# Patient Record
Sex: Female | Born: 1945 | Race: White | Hispanic: No | Marital: Married | Smoking: Never smoker
Health system: Southern US, Community
[De-identification: ages and names within clinical notes are randomized; demographics above are authoritative.]

## PROBLEM LIST (undated history)

## (undated) HISTORY — PX: OTHER SURGICAL HISTORY: SHX169

## (undated) HISTORY — PX: PARTIAL HYSTERECTOMY: SHX80

## (undated) HISTORY — PX: APPENDECTOMY: SHX54

---

## 1999-06-22 ENCOUNTER — Other Ambulatory Visit: Admission: RE | Admit: 1999-06-22 | Discharge: 1999-06-22 | Payer: Self-pay | Admitting: Family Medicine

## 2000-08-29 ENCOUNTER — Other Ambulatory Visit: Admission: RE | Admit: 2000-08-29 | Discharge: 2000-08-29 | Payer: Self-pay | Admitting: Family Medicine

## 2001-04-04 ENCOUNTER — Ambulatory Visit: Admission: RE | Admit: 2001-04-04 | Discharge: 2001-04-04 | Payer: Self-pay | Admitting: Gynecology

## 2001-05-10 ENCOUNTER — Encounter: Payer: Self-pay | Admitting: Gynecology

## 2001-05-15 ENCOUNTER — Inpatient Hospital Stay (HOSPITAL_COMMUNITY): Admission: RE | Admit: 2001-05-15 | Discharge: 2001-05-18 | Payer: Self-pay | Admitting: Gynecology

## 2001-05-22 ENCOUNTER — Inpatient Hospital Stay (HOSPITAL_COMMUNITY): Admission: AD | Admit: 2001-05-22 | Discharge: 2001-05-22 | Payer: Self-pay | Admitting: *Deleted

## 2001-05-25 ENCOUNTER — Encounter: Payer: Self-pay | Admitting: Anesthesiology

## 2001-05-25 ENCOUNTER — Inpatient Hospital Stay (HOSPITAL_COMMUNITY): Admission: AD | Admit: 2001-05-25 | Discharge: 2001-06-06 | Payer: Self-pay | Admitting: Obstetrics & Gynecology

## 2001-05-27 ENCOUNTER — Encounter: Payer: Self-pay | Admitting: Obstetrics & Gynecology

## 2001-05-28 ENCOUNTER — Encounter (HOSPITAL_BASED_OUTPATIENT_CLINIC_OR_DEPARTMENT_OTHER): Payer: Self-pay | Admitting: General Surgery

## 2001-05-29 ENCOUNTER — Encounter: Payer: Self-pay | Admitting: Obstetrics & Gynecology

## 2001-06-01 ENCOUNTER — Encounter: Payer: Self-pay | Admitting: Obstetrics & Gynecology

## 2001-07-11 ENCOUNTER — Ambulatory Visit: Admission: RE | Admit: 2001-07-11 | Discharge: 2001-07-11 | Payer: Self-pay | Admitting: Gynecology

## 2004-09-08 ENCOUNTER — Ambulatory Visit: Payer: Self-pay | Admitting: Family Medicine

## 2005-04-08 ENCOUNTER — Ambulatory Visit: Payer: Self-pay | Admitting: Family Medicine

## 2005-05-04 ENCOUNTER — Ambulatory Visit: Payer: Self-pay | Admitting: Family Medicine

## 2005-10-25 ENCOUNTER — Ambulatory Visit: Payer: Self-pay | Admitting: Family Medicine

## 2005-12-06 ENCOUNTER — Ambulatory Visit: Payer: Self-pay | Admitting: Family Medicine

## 2005-12-15 ENCOUNTER — Ambulatory Visit: Payer: Self-pay | Admitting: Family Medicine

## 2005-12-21 ENCOUNTER — Ambulatory Visit: Payer: Self-pay | Admitting: Family Medicine

## 2012-04-18 DIAGNOSIS — R32 Unspecified urinary incontinence: Secondary | ICD-10-CM

## 2012-04-18 DIAGNOSIS — M1990 Unspecified osteoarthritis, unspecified site: Secondary | ICD-10-CM

## 2012-04-18 DIAGNOSIS — R0602 Shortness of breath: Secondary | ICD-10-CM | POA: Insufficient documentation

## 2012-04-18 DIAGNOSIS — M549 Dorsalgia, unspecified: Secondary | ICD-10-CM

## 2012-04-18 DIAGNOSIS — I10 Essential (primary) hypertension: Secondary | ICD-10-CM

## 2012-04-18 DIAGNOSIS — G4733 Obstructive sleep apnea (adult) (pediatric): Secondary | ICD-10-CM

## 2012-04-18 DIAGNOSIS — E785 Hyperlipidemia, unspecified: Secondary | ICD-10-CM

## 2012-04-18 HISTORY — DX: Unspecified osteoarthritis, unspecified site: M19.90

## 2012-04-18 HISTORY — DX: Unspecified urinary incontinence: R32

## 2012-04-18 HISTORY — DX: Hyperlipidemia, unspecified: E78.5

## 2012-04-18 HISTORY — DX: Shortness of breath: R06.02

## 2012-04-18 HISTORY — DX: Obstructive sleep apnea (adult) (pediatric): G47.33

## 2012-04-18 HISTORY — DX: Dorsalgia, unspecified: M54.9

## 2012-04-18 HISTORY — DX: Essential (primary) hypertension: I10

## 2015-12-01 DIAGNOSIS — Z79899 Other long term (current) drug therapy: Secondary | ICD-10-CM | POA: Insufficient documentation

## 2015-12-01 DIAGNOSIS — G5712 Meralgia paresthetica, left lower limb: Secondary | ICD-10-CM | POA: Insufficient documentation

## 2015-12-01 DIAGNOSIS — G5600 Carpal tunnel syndrome, unspecified upper limb: Secondary | ICD-10-CM | POA: Insufficient documentation

## 2015-12-01 DIAGNOSIS — M51369 Other intervertebral disc degeneration, lumbar region without mention of lumbar back pain or lower extremity pain: Secondary | ICD-10-CM

## 2015-12-01 DIAGNOSIS — K439 Ventral hernia without obstruction or gangrene: Secondary | ICD-10-CM

## 2015-12-01 DIAGNOSIS — M5136 Other intervertebral disc degeneration, lumbar region: Secondary | ICD-10-CM

## 2015-12-01 DIAGNOSIS — M1710 Unilateral primary osteoarthritis, unspecified knee: Secondary | ICD-10-CM

## 2015-12-01 DIAGNOSIS — L304 Erythema intertrigo: Secondary | ICD-10-CM

## 2015-12-01 HISTORY — DX: Meralgia paresthetica, left lower limb: G57.12

## 2015-12-01 HISTORY — DX: Erythema intertrigo: L30.4

## 2015-12-01 HISTORY — DX: Ventral hernia without obstruction or gangrene: K43.9

## 2015-12-01 HISTORY — DX: Other long term (current) drug therapy: Z79.899

## 2015-12-01 HISTORY — DX: Other intervertebral disc degeneration, lumbar region without mention of lumbar back pain or lower extremity pain: M51.369

## 2015-12-01 HISTORY — DX: Unilateral primary osteoarthritis, unspecified knee: M17.10

## 2015-12-01 HISTORY — DX: Other intervertebral disc degeneration, lumbar region: M51.36

## 2015-12-08 DIAGNOSIS — R1314 Dysphagia, pharyngoesophageal phase: Secondary | ICD-10-CM | POA: Insufficient documentation

## 2015-12-08 DIAGNOSIS — K219 Gastro-esophageal reflux disease without esophagitis: Secondary | ICD-10-CM

## 2015-12-08 HISTORY — DX: Gastro-esophageal reflux disease without esophagitis: K21.9

## 2015-12-08 HISTORY — DX: Dysphagia, pharyngoesophageal phase: R13.14

## 2015-12-17 DIAGNOSIS — Z6841 Body Mass Index (BMI) 40.0 and over, adult: Secondary | ICD-10-CM

## 2015-12-17 DIAGNOSIS — R319 Hematuria, unspecified: Secondary | ICD-10-CM

## 2015-12-17 DIAGNOSIS — K5909 Other constipation: Secondary | ICD-10-CM

## 2015-12-17 DIAGNOSIS — I872 Venous insufficiency (chronic) (peripheral): Secondary | ICD-10-CM | POA: Insufficient documentation

## 2015-12-17 DIAGNOSIS — R609 Edema, unspecified: Secondary | ICD-10-CM

## 2015-12-17 HISTORY — DX: Other constipation: K59.09

## 2015-12-17 HISTORY — DX: Body Mass Index (BMI) 40.0 and over, adult: Z684

## 2015-12-17 HISTORY — DX: Venous insufficiency (chronic) (peripheral): I87.2

## 2015-12-17 HISTORY — DX: Hematuria, unspecified: R31.9

## 2015-12-17 HISTORY — DX: Edema, unspecified: R60.9

## 2016-03-09 DIAGNOSIS — R5383 Other fatigue: Secondary | ICD-10-CM

## 2016-03-09 HISTORY — DX: Other fatigue: R53.83

## 2019-01-24 DIAGNOSIS — R269 Unspecified abnormalities of gait and mobility: Secondary | ICD-10-CM | POA: Insufficient documentation

## 2019-01-24 HISTORY — DX: Unspecified abnormalities of gait and mobility: R26.9

## 2019-01-25 DIAGNOSIS — E669 Obesity, unspecified: Secondary | ICD-10-CM

## 2019-01-25 DIAGNOSIS — E1169 Type 2 diabetes mellitus with other specified complication: Secondary | ICD-10-CM | POA: Insufficient documentation

## 2019-01-25 HISTORY — DX: Type 2 diabetes mellitus with other specified complication: E66.9

## 2019-01-31 DIAGNOSIS — R195 Other fecal abnormalities: Secondary | ICD-10-CM

## 2019-01-31 DIAGNOSIS — K921 Melena: Secondary | ICD-10-CM

## 2019-01-31 HISTORY — DX: Other fecal abnormalities: R19.5

## 2019-01-31 HISTORY — DX: Melena: K92.1

## 2019-03-15 ENCOUNTER — Other Ambulatory Visit: Payer: Self-pay | Admitting: Unknown Physician Specialty

## 2019-03-15 DIAGNOSIS — R195 Other fecal abnormalities: Secondary | ICD-10-CM

## 2019-04-09 ENCOUNTER — Ambulatory Visit
Admission: RE | Admit: 2019-04-09 | Discharge: 2019-04-09 | Disposition: A | Discharge status: Home / Self Care | Payer: Medicare Other | Source: Ambulatory Visit | Attending: Unknown Physician Specialty | Admitting: Unknown Physician Specialty

## 2019-04-09 DIAGNOSIS — R195 Other fecal abnormalities: Secondary | ICD-10-CM

## 2019-07-17 DIAGNOSIS — Z789 Other specified health status: Secondary | ICD-10-CM

## 2019-07-17 HISTORY — DX: Other specified health status: Z78.9

## 2019-10-29 DIAGNOSIS — L602 Onychogryphosis: Secondary | ICD-10-CM

## 2019-10-29 HISTORY — DX: Onychogryphosis: L60.2

## 2020-01-20 DIAGNOSIS — M79672 Pain in left foot: Secondary | ICD-10-CM | POA: Insufficient documentation

## 2020-01-20 HISTORY — DX: Pain in left foot: M79.672

## 2020-09-22 IMAGING — CT CT VIRTUAL COLONOSCOPY DIAGNOSTIC
2 of 5 series · 15 of 46 positions shown, 17 images · non-contrast
Comparison: [facility] CT abdomen/pelvis dated 01/14/2013

CLINICAL DATA: Occult GI bleeding

EXAM:
CT VIRTUAL COLONOSCOPY DIAGNOSTIC
TECHNIQUE: The patient was given a standard Mag citrate bowel preparation with
Gastrografin and barium for fluid and stool tagging respectively.
The quality of the bowel preparation is moderate. Automated CO2
insufflation of the colon was performed prior to image acquisition
and colonic distention is moderate. Image post processing was used
to generate a 3D endoluminal fly-through projection of the colon and
to electronically subtract stool/fluid as appropriate.

[Series 2: supine colon 1.50 br40 s3 supine thins · axial · 0.98mm/px · z∈[+1277,+1672]mm · 12 of 295 slices shown, 14 images]
[im 16/295  soft-tissue]
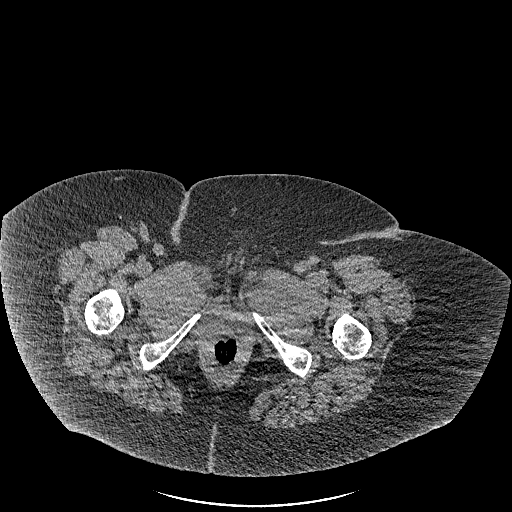
[im 16/295  bone]
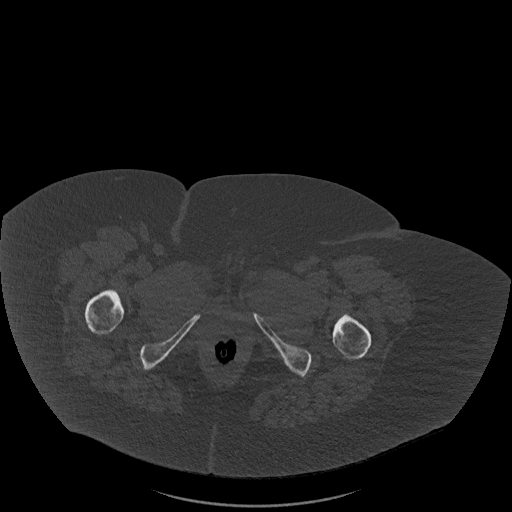
[im 47/295  soft-tissue]
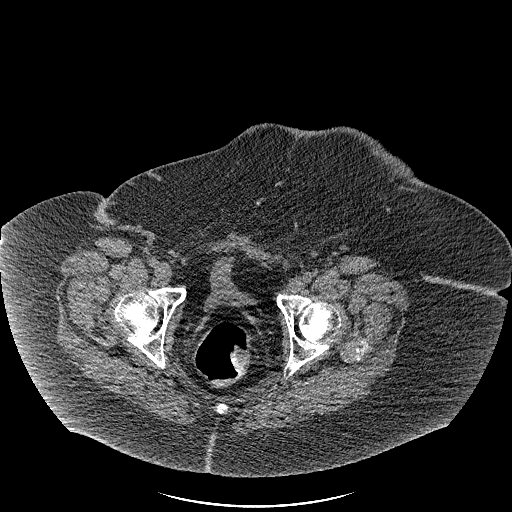
[im 62/295  soft-tissue]
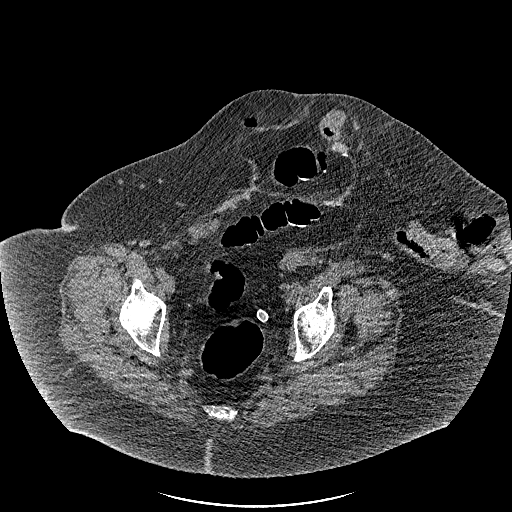
[im 93/295  soft-tissue]
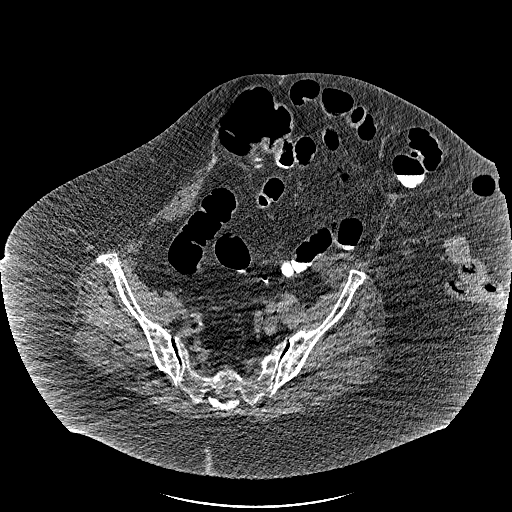
[im 109/295  soft-tissue]
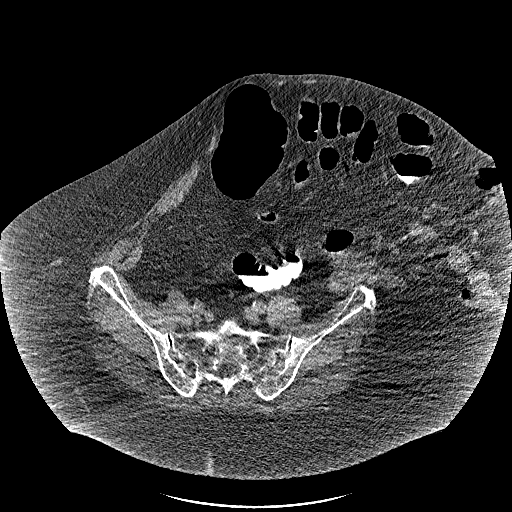
[im 140/295  soft-tissue]
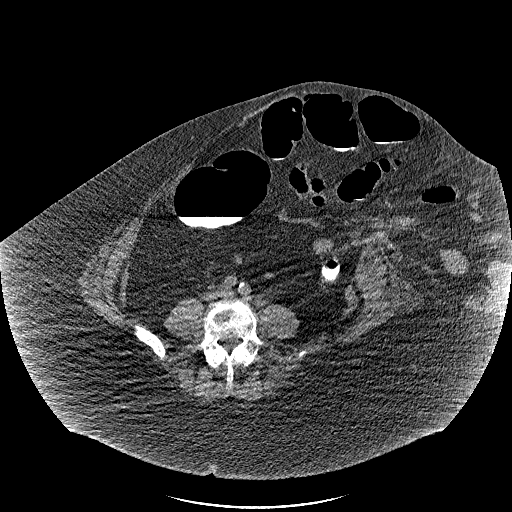
[im 155/295  soft-tissue]
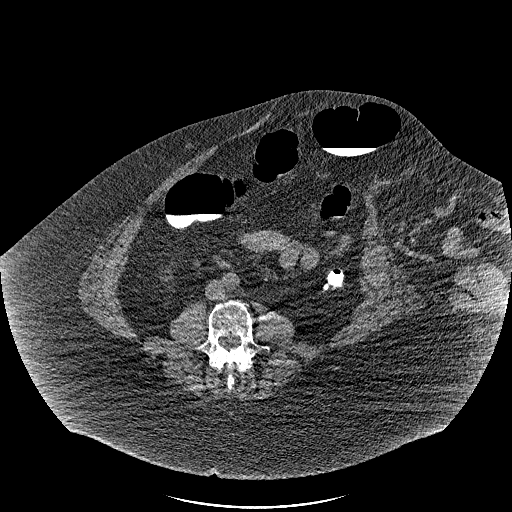
[im 186/295  soft-tissue]
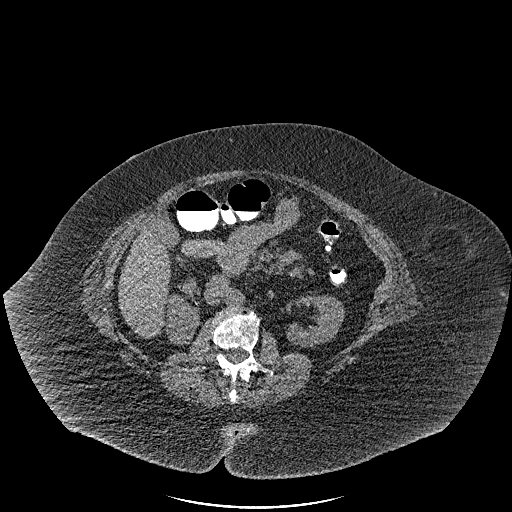
[im 202/295  soft-tissue]
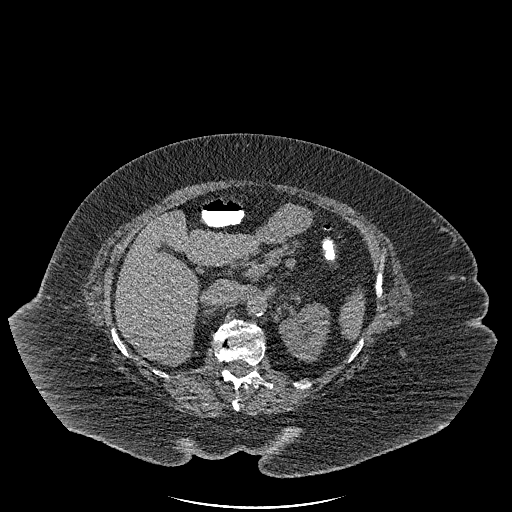
[im 202/295  bone]
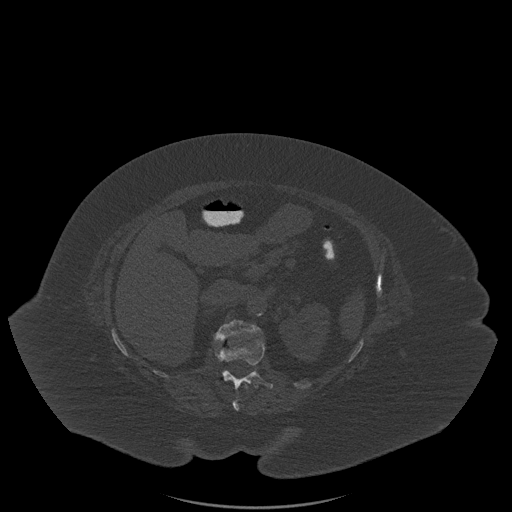
[im 233/295  soft-tissue]
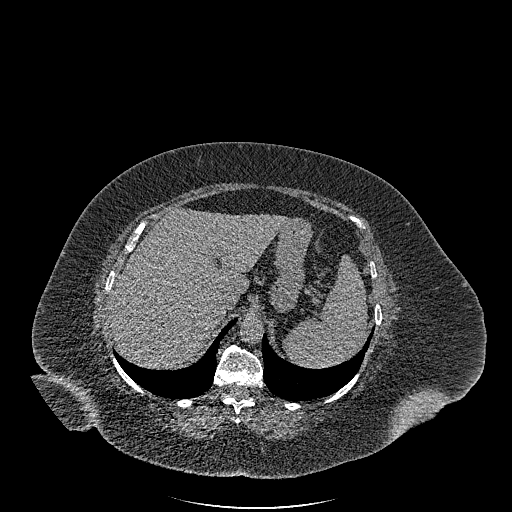
[im 248/295  soft-tissue]
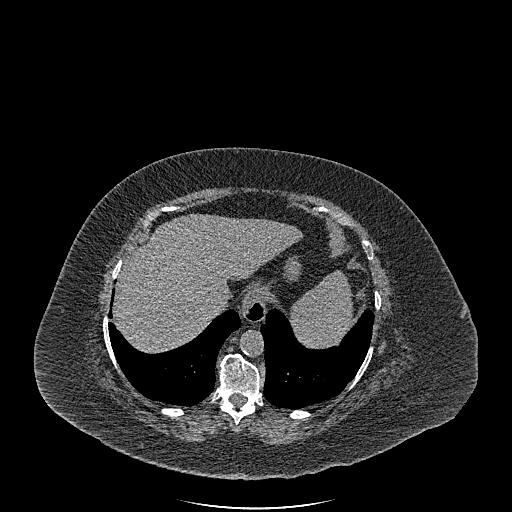
[im 279/295  soft-tissue]
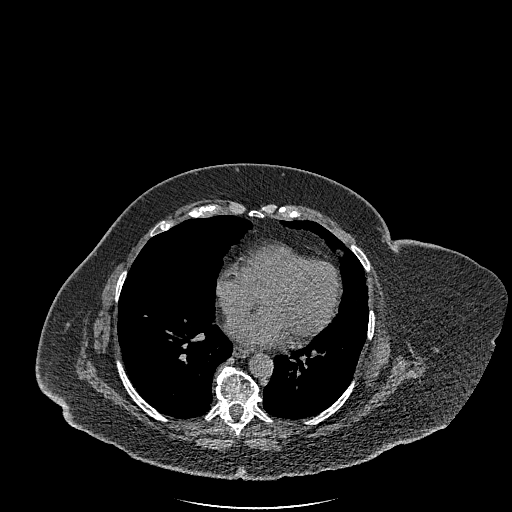

[Series 4: supine colon 3.00 br40 s3 cor supine · coronal · 0.88mm/px · 3 of 156 slices shown]
[im 52/156  soft-tissue]
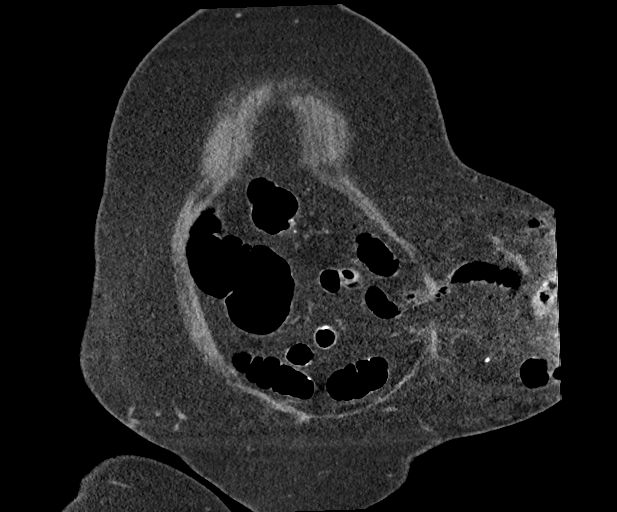
[im 69/156  soft-tissue]
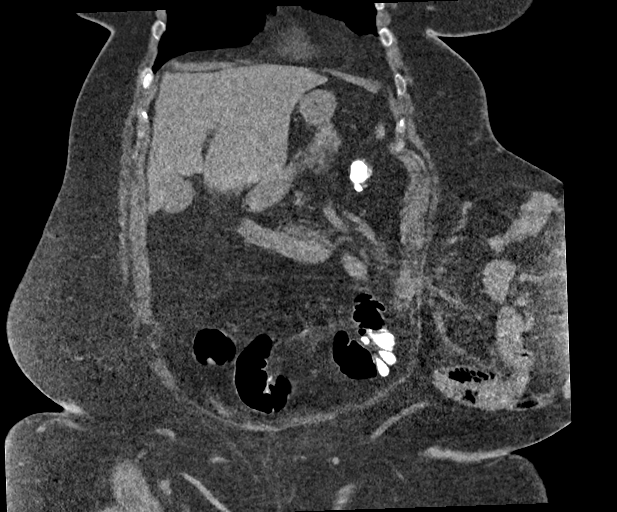
[im 87/156  soft-tissue]
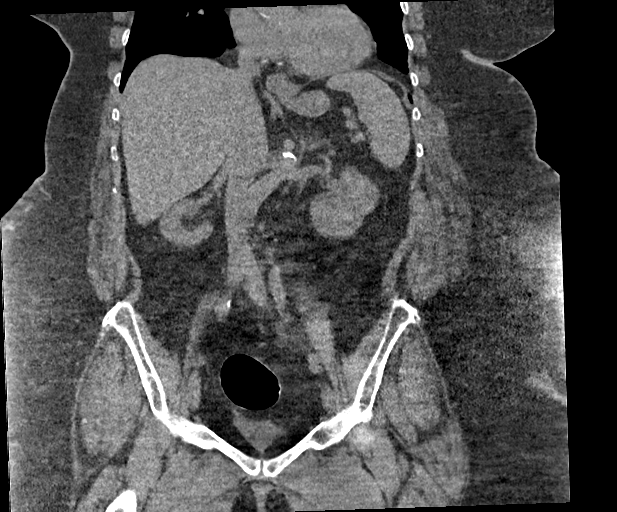

[15 of 46 positions shown; findings below may reference images not displayed]

FINDINGS: VIRTUAL COLONOSCOPY

Mild layering fluid/contrast in the colon. Patient was unable to be
repositioned for imaging in the prone or right lateral decubitus
position, and as such, the fluid/contrast mildly limits evaluation.

Large complex left lateral ventral hernia, mostly containing loops
of small bowel (incompletely imaged), and therefore not appreciably
limiting evaluation of the colon.

No significant colonic polyp, mass, apple core lesion, or stricture.

Redundant sigmoid colon. Left colonic diverticulosis, without
evidence of diverticulitis.

No evidence of bowel obstruction. Large, complex left ventral hernia
containing multiple loops of predominantly small bowel, incompletely
visualized.

Appendix is not discretely visualized.

Virtual colonoscopy is not designed to detect diminutive polyps
(i.e., less than or equal to 5 mm), the presence or absence of which
may not affect clinical management.

CT ABDOMEN AND PELVIS WITHOUT CONTRAST

Lower chest: Lung bases are clear.

Hepatobiliary: Unenhanced liver is unremarkable.

Gallbladder is unremarkable. No intrahepatic or extrahepatic ductal
dilatation.

Pancreas: Within normal limits.

Spleen: Within normal limits.

Adrenals/Urinary Tract: Adrenal glands are within normal limits.

Kidneys are within normal limits. No renal calculi hydronephrosis.

Bladder is underdistended but unremarkable.

Stomach/Bowel: Stomach is notable for a small hiatal hernia.

Visualized bowel is described above.

Vascular/Lymphatic: No evidence of abdominal aortic aneurysm.

Atherosclerotic calcifications of the abdominal aorta and branch
vessels.

No suspicious abdominopelvic lymphadenopathy.

Reproductive: Status post hysterectomy.  No adnexal masses.

Other: No abdominopelvic ascites.

Musculoskeletal: Degenerative changes of the visualized
thoracolumbar spine.
IMPRESSION: No significant colonic polyp, mass, apple core lesion, or stricture.

Left colonic diverticulosis, without evidence of diverticulitis.

Large, complex left ventral hernia containing multiple loops of
predominantly small bowel, incompletely imaged.

Additional ancillary findings as above.

## 2020-09-28 DIAGNOSIS — I350 Nonrheumatic aortic (valve) stenosis: Secondary | ICD-10-CM | POA: Diagnosis not present

## 2020-09-28 DIAGNOSIS — I340 Nonrheumatic mitral (valve) insufficiency: Secondary | ICD-10-CM | POA: Diagnosis not present

## 2020-09-28 DIAGNOSIS — I361 Nonrheumatic tricuspid (valve) insufficiency: Secondary | ICD-10-CM | POA: Diagnosis not present

## 2020-10-02 DIAGNOSIS — I480 Paroxysmal atrial fibrillation: Secondary | ICD-10-CM

## 2020-10-02 DIAGNOSIS — R55 Syncope and collapse: Secondary | ICD-10-CM

## 2020-10-02 DIAGNOSIS — R42 Dizziness and giddiness: Secondary | ICD-10-CM

## 2020-10-02 DIAGNOSIS — F418 Other specified anxiety disorders: Secondary | ICD-10-CM | POA: Insufficient documentation

## 2020-10-02 HISTORY — DX: Paroxysmal atrial fibrillation: I48.0

## 2020-10-02 HISTORY — DX: Syncope and collapse: R55

## 2020-10-02 HISTORY — DX: Syncope and collapse: R42

## 2020-10-02 HISTORY — DX: Other specified anxiety disorders: F41.8

## 2020-10-05 DIAGNOSIS — Z6841 Body Mass Index (BMI) 40.0 and over, adult: Secondary | ICD-10-CM

## 2020-10-05 DIAGNOSIS — Z7901 Long term (current) use of anticoagulants: Secondary | ICD-10-CM | POA: Insufficient documentation

## 2020-10-05 HISTORY — DX: Body Mass Index (BMI) 40.0 and over, adult: Z684

## 2020-10-05 HISTORY — DX: Morbid (severe) obesity due to excess calories: E66.01

## 2020-10-05 HISTORY — DX: Long term (current) use of anticoagulants: Z79.01

## 2020-11-24 DIAGNOSIS — Z7901 Long term (current) use of anticoagulants: Secondary | ICD-10-CM | POA: Insufficient documentation

## 2020-11-24 DIAGNOSIS — S32040A Wedge compression fracture of fourth lumbar vertebra, initial encounter for closed fracture: Secondary | ICD-10-CM

## 2020-11-24 HISTORY — DX: Long term (current) use of anticoagulants: Z79.01

## 2020-11-24 HISTORY — DX: Wedge compression fracture of fourth lumbar vertebra, initial encounter for closed fracture: S32.040A

## 2021-01-19 DIAGNOSIS — I10 Essential (primary) hypertension: Secondary | ICD-10-CM

## 2021-01-19 HISTORY — DX: Essential (primary) hypertension: I10

## 2021-07-19 DIAGNOSIS — Z636 Dependent relative needing care at home: Secondary | ICD-10-CM

## 2021-07-19 HISTORY — DX: Dependent relative needing care at home: Z63.6

## 2021-09-01 DIAGNOSIS — I951 Orthostatic hypotension: Secondary | ICD-10-CM | POA: Insufficient documentation

## 2021-09-01 HISTORY — DX: Orthostatic hypotension: I95.1

## 2021-09-02 ENCOUNTER — Ambulatory Visit: Payer: Medicare Other

## 2021-09-02 ENCOUNTER — Telehealth: Payer: Self-pay | Admitting: Cardiology

## 2021-09-02 DIAGNOSIS — I4891 Unspecified atrial fibrillation: Secondary | ICD-10-CM

## 2021-09-02 NOTE — Telephone Encounter (Signed)
Spoke to the patient just now and got her scheduled to come tomorrow at 10 am for this monitor to be placed.    Encouraged patient to call back with any questions or concerns.

## 2021-09-02 NOTE — Telephone Encounter (Signed)
I called patient to schedule a consult per her PCP. She stated her PCP is requesting a heart monitor. Could this be done before her appointment with Dr. Dulce Sellar on 10/22/21? If so, please order the monitor and let me know. I will call  the patient to schedule appt for heart monitor.  Thanks!

## 2021-09-03 ENCOUNTER — Ambulatory Visit: Payer: Medicare Other

## 2021-09-03 ENCOUNTER — Other Ambulatory Visit: Payer: Self-pay

## 2021-09-03 ENCOUNTER — Ambulatory Visit (INDEPENDENT_AMBULATORY_CARE_PROVIDER_SITE_OTHER): Payer: Medicare Other

## 2021-09-03 DIAGNOSIS — I4891 Unspecified atrial fibrillation: Secondary | ICD-10-CM

## 2021-09-17 ENCOUNTER — Telehealth: Payer: Self-pay

## 2021-09-17 NOTE — Telephone Encounter (Signed)
Spoke with patient regarding results and recommendation.  Patient verbalizes understanding and is agreeable to plan of care. Advised patient to call back with any issues or concerns.  

## 2021-09-17 NOTE — Telephone Encounter (Signed)
-----   Message from Baldo Daub, MD sent at 09/17/2021 11:33 AM EST ----- She is scheduled to see me at the end of January the monitor showed atrial fibrillation with a well-controlled rate please be sure a copy of this goes to her primary care physician they want the monitoring done before the office visit.  She is a new patient.

## 2021-10-19 ENCOUNTER — Other Ambulatory Visit: Payer: Self-pay

## 2021-10-19 DIAGNOSIS — K432 Incisional hernia without obstruction or gangrene: Secondary | ICD-10-CM

## 2021-10-19 HISTORY — DX: Incisional hernia without obstruction or gangrene: K43.2

## 2021-10-22 ENCOUNTER — Other Ambulatory Visit: Payer: Self-pay

## 2021-10-22 ENCOUNTER — Encounter: Payer: Self-pay | Admitting: Cardiology

## 2021-10-22 ENCOUNTER — Ambulatory Visit (INDEPENDENT_AMBULATORY_CARE_PROVIDER_SITE_OTHER): Payer: Medicare Other | Admitting: Cardiology

## 2021-10-22 VITALS — BP 138/76 | HR 106 | Ht 62.0 in | Wt 292.6 lb

## 2021-10-22 DIAGNOSIS — Q231 Congenital insufficiency of aortic valve: Secondary | ICD-10-CM

## 2021-10-22 DIAGNOSIS — I951 Orthostatic hypotension: Secondary | ICD-10-CM | POA: Diagnosis not present

## 2021-10-22 DIAGNOSIS — I4821 Permanent atrial fibrillation: Secondary | ICD-10-CM

## 2021-10-22 DIAGNOSIS — Z7901 Long term (current) use of anticoagulants: Secondary | ICD-10-CM | POA: Diagnosis not present

## 2021-10-22 DIAGNOSIS — I119 Hypertensive heart disease without heart failure: Secondary | ICD-10-CM | POA: Insufficient documentation

## 2021-10-22 DIAGNOSIS — E1169 Type 2 diabetes mellitus with other specified complication: Secondary | ICD-10-CM

## 2021-10-22 DIAGNOSIS — E669 Obesity, unspecified: Secondary | ICD-10-CM

## 2021-10-22 DIAGNOSIS — I350 Nonrheumatic aortic (valve) stenosis: Secondary | ICD-10-CM

## 2021-10-22 NOTE — Progress Notes (Signed)
Cardiology Office Note:    Date:  10/22/2021   ID:  Erika Yang, DOB 1946-02-24, MRN [id number]  PCP:  Maris Berger, MD  Cardiologist:  Shirlee More, MD   Referring MD: Nehemiah Settle, MD  ASSESSMENT:    1. Permanent atrial fibrillation ([place])   2. Chronic anticoagulation   3. Hypertensive heart disease without heart failure   4. Hypotension, postural   5. Diabetes mellitus type 2 in obese ([place])   6. Bicuspid aortic valve   7. Nonrheumatic aortic valve stenosis    PLAN:    In order of problems listed above:  She has had permanent or chronic atrial fibrillation in excess of 1 year the rate is well controlled on extended ambulatory monitor and she is anticoagulated.  Her episodes are unrelated to her cardiac rhythm I agree with her PCP that is orthostatic in nature she remains on her ARB continues to have episodes and from a safety perspective I told her to discontinue the drug.  She will start to trend her blood pressure problems sit standing.  She has an appointment see Dr. Dema Severin in the near future.  She will continue her rate limiting calcium channel blocker Continue anticoagulation moderate stroke risk with age hypertension and diabetes. See above probably discontinue her calcium channel blocker having symptomatic episodes of hypotension related to prolonged standing Stable diabetes currently on metformin She has mild aortic stenosis will need a repeat echocardiogram in about 1 year I will have her back in my office generally progression is very slow however can be more rapid in patients with bicuspid aortic valve. She has plans to see orthopedic surgery for ambulatory dysfunction and what sounds like locking of her knee usually related to very severe degenerative knee changes.  Next appointment 1 year   Medication Adjustments/Labs and Tests Ordered: Current medicines are reviewed at length with the patient today.  Concerns regarding medicines are outlined above.  Orders  Placed This Encounter  Procedures   EKG 12-Lead   No orders of the defined types were placed in this encounter.    Chief Complaint  Patient presents with   Loss of Consciousness   Low BP  I continue to have episodes when I am Quite lightheaded with standing for long period of time in the kitchen working although although it is better  History of Present Illness:    Erika Yang is a 76 y.o. female who is being seen today for the evaluation of atrial fibrillation at the request of her PCP there is a at the office visit 10/20/2021 and she had used an extended ambulatory monitor showing 100% atrial fibrillation with controlled rate of 50 to 110 bpm.  Her dizziness was felt to be due to blood pressure was improved after reducing the dose of her ARB.  I have searched our records we have not seen her in the office although she has been seen by cardiology [facility] 05/17/2021 for atrial fibrillation felt to be asymptomatic and rate controlled with verapamil and anticoagulated with apixaban.  EKG done at [facility] 09/28/2020 shows atrial fibrillation rate of 100 210 bpm low voltage nonspecific ST abnormality  She was seen in the ED at [facility] 03/26/2002 2022 with atrial fibrillation he has a background history of hypertension hyperlipidemia and symptomatic hypotension. Echocardiogram at that time showed normal right ventricular size function wall thickness systolic function left atrium was moderately enlarged aortic valve was described as bicuspid left atrium is with  mild aortic stenosis moderate mitral and mild tricuspid regurgitation.  The discharge summary relates that her heart rate has been controlled With verapamil and the patient was discharged from the hospital  She was seen with her PCP in the office 10/20/2021 with a diagnosis of postural dizziness near syncope and postural hypotension she is anticoagulated.  Her blood pressures recorded 122/80 heart rates  99-113 bpm  Recent labs 09/01/2021 showed a BNP level low at 194, BMP with potassium 3.5 creatinine 0.86 sodium 140 GFR of 71 cc/min, hemoglobin 13.7 platelets 131 pounds Lipid profile 01/19/2021 cholesterol 201 LDL 123 triglycerides 110 HDL 58 TSH 10/02/2020 normal 1.20.    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  The episodes resolved with sitting.  She was documented to have hypotension her dose of ARB was decreased she is doing better.  She still has intermittent less severe episodes.  She is taking ARB for hypertension and her diabetes. She has no awareness of the atrial fibrillation no edema shortness of breath chest pain and has not lost consciousness  She wore an ambulatory monitor through our office I independently reviewed as part of the visit today her heart rate is predominantly in the 50 to 110 bpm range daytime and nighttime no bradycardia or pauses.  She has no known heart disease congenital rheumatic heart failure. She thought she had seen [place] before we stopped we researched [facility] records and she has not been seen by my practice. She was seen by Dr. Elsie Saas part of the [facility] cardiology group in August 2022 she did not remember. Past Medical History:  Diagnosis Date   Acute foot pain, left 01/20/2020   Anticoagulated by anticoagulation treatment 10/05/2020   Last Assessment & Plan:  Formatting of this note might be  different from the original. Continue Eliquis.   Arthritis 04/18/2012   Back pain 04/18/2012   BMI 50.0-59.9, adult (HCC) 12/17/2015   Caregiver stress 07/19/2021   Caregiver with fatigue 03/09/2016   Class 3 severe obesity due to excess calories with serious comorbidity and body mass index (BMI) of 50.0 to 59.9 in adult [facility]) 10/05/2020   Last Assessment & Plan:  Formatting of this note might be different from the original. This is a major issue.  Getting around slowly but steadily with rolling walker.  Still driving and caring for her mother.  It is stressful.   Compression fracture of L4 vertebra (HCC) 11/24/2020   Constipation, chronic 12/17/2015   Current use of long term anticoagulation 11/24/2020   Degenerative lumbar disc 12/01/2015   Diabetes mellitus type 2 in obese (HCC) 01/25/2019   Drug therapy 12/01/2015   Edema 12/17/2015   Elevated blood pressure reading in office with diagnosis of hypertension 01/19/2021   Essential (primary) hypertension 04/18/2012   Last Assessment & Plan:  Formatting of this note might be different from the original. Blood pressure under good control.  Continue with candesartan 16 mg a day, HCTZ 25 mg a day and verapamil.  Gait disturbance 01/24/2019   GERD without esophagitis 12/08/2015   Hematochezia 01/31/2019   Hematuria 12/17/2015   Hyperlipemia 04/18/2012   Hypotension, postural 09/01/2021   Incisional hernia 10/19/2021   Intertrigo 12/01/2015   Meralgia paresthetica of left side 12/01/2015   Occult blood positive stool 01/31/2019   OSA on CPAP 04/18/2012   Osteoarthrosis, localized, primary, knee 12/01/2015   Overgrown toenails 10/29/2019   Paroxysmal atrial fibrillation (HCC) 10/02/2020   Last Assessment & Plan:  Formatting of this note might be different from the original. Asymptomatic and rate controlled.  Continue with verapamil SR 120 b.i.d. and Eliquis.  No bleeding or falls reported.   Pharyngoesophageal dysphagia 12/08/2015   Postural dizziness with presyncope 10/02/2020    Pre-syncope 10/02/2020   Shortness of breath 04/18/2012   Situational anxiety 10/02/2020   Statin intolerance 07/17/2019   Urinary incontinence 04/18/2012   Venous insufficiency 12/17/2015   Ventral hernia 12/01/2015    Past Surgical History:  Procedure Laterality Date   APPENDECTOMY     cyst removal from ovaries     PARTIAL HYSTERECTOMY      Current Medications: Current Meds  Medication Sig   Calcium Citrate-Vitamin D 315-5 MG-MCG TABS Take 1 tablet by mouth daily.   candesartan (ATACAND) 8 MG tablet Take 8 mg by mouth daily.   Docusate Sodium (STOOL SOFTENER LAXATIVE PO) Take 1 tablet by mouth as needed (constipation).   ELIQUIS 5 MG TABS tablet Take 5 mg by mouth 2 (two) times daily.   hydrochlorothiazide (HYDRODIURIL) 25 MG tablet Take 25 mg by mouth every morning.   metFORMIN (GLUCOPHAGE-XR) 500 MG 24 hr tablet Take 500 mg by mouth daily.   Multiple Vitamin (MULTI-VITAMIN) tablet Take 1 tablet by mouth daily. Unknown strenght   nystatin (MYCOSTATIN/NYSTOP) powder Apply 1 application topically as needed for rash.   omeprazole (PRILOSEC) 40 MG capsule Take 40 mg by mouth daily.   verapamil (CALAN-SR) 120 MG CR tablet Take 120 mg by mouth 2 (two) times daily.     Allergies:   Penicillin g, Lisinopril, and Pravastatin   Social History   Socioeconomic History   Marital status: Married    Spouse name: Not on file   Number of children: Not on file   Years of education: Not on file   Highest education level: Not on file  Occupational History   Not on file  Tobacco Use   Smoking status: Never   Smokeless tobacco: Never  Substance and Sexual Activity   Alcohol use: Never   Drug use: Never   Sexual activity: Not Currently  Other Topics Concern   Not on file  Social History Narrative   Not on file   Social Determinants of Health   Financial Resource Strain: Not on file  Food Insecurity: Not on file  Transportation Needs: Not on file  Physical Activity: Not on file   Stress: Not on file  Social Connections: Not on file     Family History: The patient's family history includes Heart failure in her mother.  ROS:   ROS Please see the history of present illness.     All other systems reviewed and are negative.  EKGs/Labs/Other Studies Reviewed:    The following studies were reviewed today:   EKG:  EKG is  ordered today.  The ekg ordered today is personally reviewed and demonstrates rate controlled atrial fibrillation    Physical Exam:    VS:  BP 138/76 (BP Location: Right Arm, Patient Position: Sitting)  Pulse (!) 106    Ht 5\' 2"  (1.575 m)    Wt 292 lb 9.6 oz (132.7 kg)    SpO2 96%    BMI 53.52 kg/m     Wt Readings from Last 3 Encounters:  10/22/21 292 lb 9.6 oz (132.7 kg)     GEN: Appears her age obese BMI greater than 50 uses a walker in the office well nourished, well developed in no acute distress HEENT: Normal NECK: No JVD; No carotid bruits LYMPHATICS: No lymphadenopathy CARDIAC: Irregular rate and  no murmurs, rubs, gallops RESPIRATORY:  Clear to auscultation without rales, wheezing or rhonchi  ABDOMEN: Soft, non-tender, non-distended MUSCULOSKELETAL:  No edema; No deformity  SKIN: Warm and dry NEUROLOGIC:  Alert and oriented x 3 PSYCHIATRIC:  Normal affect     Signed, Shirlee More, MD  10/22/2021 9:51 AM    [place]

## 2021-10-22 NOTE — Patient Instructions (Signed)
Medication Instructions:  Your physician recommends that you continue on your current medications as directed. Please refer to the Current Medication list given to you today.  *If you need a refill on your cardiac medications before your next appointment, please call your pharmacy*   Lab Work: NONE If you have labs (blood work) drawn today and your tests are completely normal, you will receive your results only by: MyChart Message (if you have MyChart) OR A paper copy in the mail If you have any lab test that is abnormal or we need to change your treatment, we will call you to review the results.   Testing/Procedures: NONE   Follow-Up: At [facility], you and your health needs are our priority.  As part of our continuing mission to provide you with exceptional heart care, we have created designated Provider Care Teams.  These Care Teams include your primary Cardiologist (physician) and Advanced Practice Providers (APPs -  Physician Assistants and Nurse Practitioners) who all work together to provide you with the care you need, when you need it.  We recommend signing up for the patient portal called "MyChart".  Sign up information is provided on this After Visit Summary.  MyChart is used to connect with patients for Virtual Visits (Telemedicine).  Patients are able to view lab/test results, encounter notes, upcoming appointments, etc.  Non-urgent messages can be sent to your provider as well.   To learn more about what you can do with MyChart, go to [email or web address].    Your next appointment:   1 year(s)  The format for your next appointment:   In Person  Provider:   Norman Herrlich, MD    Other Instructions Check BP and Pulse at home sitting and standing for 2-3 weeks and record results. Can mail to our office or drop by

## 2021-11-02 ENCOUNTER — Telehealth: Payer: Self-pay | Admitting: Cardiology

## 2021-11-02 MED ORDER — VERAPAMIL HCL ER 120 MG PO TBCR: 120.0000 mg | EXTENDED_RELEASE_TABLET | Freq: Two times a day (BID) | ORAL | 3 refills | Status: AC

## 2021-11-02 NOTE — Telephone Encounter (Signed)
°*  STAT* If patient is at the pharmacy, call can be transferred to refill team.   1. Which medications need to be refilled? (please list name of each medication and dose if known) verapamil (CALAN-SR) 120 MG CR tablet  2. Which pharmacy/location (including street and city if local pharmacy) is medication to be sent to? [id number] [place], [place] - [address]  3. Do they need a 30 day or 90 day supply? 90 day  Patient will run out by Saturday

## 2021-12-03 ENCOUNTER — Telehealth: Payer: Self-pay | Admitting: Cardiology

## 2021-12-03 NOTE — Telephone Encounter (Signed)
Called pt reviewed MD recommendation: I agree with the I think at this point it is best for her primary care physician to manage her hypertension. " ?Pt verbalizes understanding and will follow up with PCP.   ?

## 2021-12-03 NOTE — Telephone Encounter (Signed)
Called pt in regards to BP and HA.  Pt reports developed a HA that thinks may be r/t BP.  Pt does not check BP regularly.  However, since HA started checks BP around every 2 hours.  Pt has been under a lot of stress.  Expressed that has had X2 deaths recently.  As well as moving mother into a facility and cleaning out her home in [place].  Pt has had increased activity d/t these issues.  Pt took a tylenol around 2 hours ago and feels better.  Pt denies CP and palpitations.  Pt reports diet is okay.  However, had steak and cheese sub with mushrooms last night.  Advised pt that this is high in sodium and along with added stress and increased activity could cause elevated BP.  Reviewed BP medications pt taking all meds except candesartan. Pt expressed Dr. Dulce Sellar advised to stop med at last OV.  Advised pt to monitor the amount of salt consumed.  Check BP 1.5-2 hours after taking BP medications.  If symptoms persist call back.  Pt request if office needs to reach use cell number.  Will route to Dr. Dulce Sellar to advise.   ?

## 2021-12-03 NOTE — Telephone Encounter (Signed)
Pt c/o BP issue: STAT if pt c/o blurred vision, one-sided weakness or slurred speech ? ?1. What are your last 5 BP readings? 176/62 HR 102; 121/68 HR 99; 175/97 HR 104 ? ?2. Are you having any other symptoms (ex. Dizziness, headache, blurred vision, passed out)? Headache  ? ?3. What is your BP issue? Bp is high  ? ?

## 2021-12-10 ENCOUNTER — Telehealth: Payer: Self-pay | Admitting: Cardiology

## 2021-12-10 MED ORDER — ELIQUIS 5 MG PO TABS: 5.0000 mg | ORAL_TABLET | Freq: Two times a day (BID) | ORAL | 0 refills | Status: AC

## 2021-12-10 NOTE — Telephone Encounter (Signed)
Prescription refill request for Eliquis received. ?Indication:Afib ?Last office visit:1/23 ?Scr:0.8 ?Age: 76 ?Weight:132.7 kg ? ?Prescription refilled ? ?

## 2021-12-10 NOTE — Telephone Encounter (Signed)
? ? ?*  STAT* If patient is at the pharmacy, call can be transferred to refill team. ? ? ?1. Which medications need to be refilled? (please list name of each medication and dose if known) ELIQUIS 5 MG TABS tablet ? ?2. Which pharmacy/location (including street and city if local pharmacy) is medication to be sent to?[id number] [place] [place] - [address] ? ?3. Do they need a 30 day or 90 day supply? 90 days ? ?Pt is running out of meds and needs refill today ?

## 2022-01-14 DIAGNOSIS — D6869 Other thrombophilia: Secondary | ICD-10-CM | POA: Insufficient documentation

## 2022-01-17 DIAGNOSIS — M7502 Adhesive capsulitis of left shoulder: Secondary | ICD-10-CM | POA: Insufficient documentation

## 2022-01-17 DIAGNOSIS — T466X5A Adverse effect of antihyperlipidemic and antiarteriosclerotic drugs, initial encounter: Secondary | ICD-10-CM | POA: Insufficient documentation

## 2022-01-17 DIAGNOSIS — Z5320 Procedure and treatment not carried out because of patient's decision for unspecified reasons: Secondary | ICD-10-CM | POA: Insufficient documentation

## 2022-01-17 DIAGNOSIS — M791 Myalgia, unspecified site: Secondary | ICD-10-CM | POA: Insufficient documentation

## 2022-01-17 DIAGNOSIS — Y92009 Unspecified place in unspecified non-institutional (private) residence as the place of occurrence of the external cause: Secondary | ICD-10-CM | POA: Insufficient documentation

## 2022-06-10 NOTE — Progress Notes (Unsigned)
Cardiology Office Note:    Date:  06/13/2022   ID:  [name], DOB 04-20-1946, MRN [id number]  PCP:  Everlean Cherry, MD  Cardiologist:  Norman Herrlich, MD    Referring MD: Everlean Cherry, MD    ASSESSMENT:    1. SOB (shortness of breath)   2. Permanent atrial fibrillation (HCC)   3. Chronic anticoagulation   4. Hypertensive heart disease without heart failure   5. Hypotension, postural   6. Bicuspid aortic valve   7. Nonrheumatic aortic valve stenosis   8. Nonrheumatic mitral valve regurgitation   9. Chest pain, unspecified type    PLAN:    In order of problems listed above:  The original diagnosis of cardiac versus pulmonary versus deconditioning and obesity is the etiology of her shortness of breath weakness and fatigue.  Echocardiogram ordered regarding left ventricular function right ventricular function pulmonary artery pressure.  She has no overt findings of heart failure on physical examination Stable rate controlled atrial fibrillation continue her rate limiting calcium channel blocker and her current anticoagulant BP at target continue current treatment including thiazide diuretic ARB calcium channel blocker currently not on a loop diuretic Check her aortic valve with a history of bicuspid valve mild stenosis and moderate regurgitation with her shortness of breath Difficult to decide the best ischemia evaluation CTA is difficult technically with atrial fibrillation and we will go ahead and do a 2-day myocardial perfusion study my office   Next appointment: 3 months   Medication Adjustments/Labs and Tests Ordered: Current medicines are reviewed at length with the patient today.  Concerns regarding medicines are outlined above.  Orders Placed This Encounter  Procedures   MYOCARDIAL PERFUSION IMAGING   ECHOCARDIOGRAM COMPLETE   No orders of the defined types were placed in this encounter.   Chief complaint referred for cardiac evaluation left arm discomfort  shortness of breath weakness and fatigue  History of Present Illness:    Erika Yang is a 76 y.o. female with a hx of permanent atrial fibrillation in excess of 1 year with chronic anticoagulation hypertensive heart disease without heart failure postural hypotension type 2 diabetes and bicuspid aortic valve with mild aortic stenosis last seen 10/22/2021.  Echocardiogram at [facility] July 2022 showed normal right ventricular size function wall thickness systolic function left atrium was moderately enlarged aortic valve was described as bicuspid left atrium is with mild aortic stenosis moderate mitral and mild tricuspid regurgitation.   Compliance with diet, lifestyle and medications: Yes  She has really struggled excessively with fatigue weakness gait joint pain as well as left shoulder pain is benefit from physical therapy.  She also has exertional shortness of breath Predominant complaint is exercise intolerance and fatigue She has obstructive sleep apnea uses CPAP Recently she had nonexertional pain in the area of the left upper arm I say shoulder she tells me its not in her shoulder no chest discomfort nonexertional mild and resolve spontaneously in 10 to 15 minutes.  The episodes of happen at rest but have not awakened her from her sleep.  She did have some pain in the left shoulder with movement but is resolved with physical therapy  Recent labs 04/28/2022 [facility] PCP: CMP with a sodium 140 potassium 3.7 creatinine 0.8 normal liver function test GFR 77 cc/min Hemoglobin 13.3 platelets of 144,000 Lipid profile 01/17/2022 cholesterol 194 triglycerides 90 HDL 62 LDL cholesterol 124 An EKG performed in her PCP office 06/08/2022 showing atrial fibrillation rate  85 beats per minutes described as low voltage consider pulmonary disease or obesity.  Past Medical History:  Diagnosis Date   Acute foot pain, left 01/20/2020   Anticoagulated by anticoagulation treatment  10/05/2020   Last Assessment & Plan:  Formatting of this note might be different from the original. Continue Eliquis.   Arthritis 04/18/2012   Back pain 04/18/2012   BMI 50.0-59.9, adult (HCC) 12/17/2015   Caregiver stress 07/19/2021   Caregiver with fatigue 03/09/2016   Class 3 severe obesity due to excess calories with serious comorbidity and body mass index (BMI) of 50.0 to 59.9 in adult [facility]) 10/05/2020   Last Assessment & Plan:  Formatting of this note might be different from the original. This is a major issue.  Getting around slowly but steadily with rolling walker.  Still driving and caring for her mother.  It is stressful.   Compression fracture of L4 vertebra (HCC) 11/24/2020   Constipation, chronic 12/17/2015   Current use of long term anticoagulation 11/24/2020   Degenerative lumbar disc 12/01/2015   Diabetes mellitus type 2 in obese (HCC) 01/25/2019   Drug therapy 12/01/2015   Edema 12/17/2015   Elevated blood pressure reading in office with diagnosis of hypertension 01/19/2021   Essential (primary) hypertension 04/18/2012   Last Assessment & Plan:  Formatting of this note might be different from the original. Blood pressure under good control.  Continue with candesartan 16 mg a day, HCTZ 25 mg a day and verapamil.   Gait disturbance 01/24/2019   GERD without esophagitis 12/08/2015   Hematochezia 01/31/2019   Hematuria 12/17/2015   Hyperlipemia 04/18/2012   Hypotension, postural 09/01/2021   Incisional hernia 10/19/2021   Intertrigo 12/01/2015   Meralgia paresthetica of left side 12/01/2015   Occult blood positive stool 01/31/2019   OSA on CPAP 04/18/2012   Osteoarthrosis, localized, primary, knee 12/01/2015   Overgrown toenails 10/29/2019   Paroxysmal atrial fibrillation (HCC) 10/02/2020   Last Assessment & Plan:  Formatting of this note might be different from the original. Asymptomatic and rate controlled.  Continue with verapamil SR 120 b.i.d. and Eliquis.  No bleeding or falls reported.    Pharyngoesophageal dysphagia 12/08/2015   Postural dizziness with presyncope 10/02/2020   Pre-syncope 10/02/2020   Shortness of breath 04/18/2012   Situational anxiety 10/02/2020   Statin intolerance 07/17/2019   Urinary incontinence 04/18/2012   Venous insufficiency 12/17/2015   Ventral hernia 12/01/2015    Past Surgical History:  Procedure Laterality Date   APPENDECTOMY     cyst removal from ovaries     PARTIAL HYSTERECTOMY      Current Medications: Current Meds  Medication Sig   Calcium Citrate-Vitamin D 315-5 MG-MCG TABS Take 1 tablet by mouth daily.   candesartan (ATACAND) 8 MG tablet Take 8 mg by mouth daily.   Docusate Sodium (STOOL SOFTENER LAXATIVE PO) Take 1 tablet by mouth as needed (constipation).   ELIQUIS 5 MG TABS tablet Take 1 tablet (5 mg total) by mouth 2 (two) times daily.   hydrochlorothiazide (HYDRODIURIL) 25 MG tablet Take 25 mg by mouth every morning.   metFORMIN (GLUCOPHAGE-XR) 500 MG 24 hr tablet Take 500 mg by mouth daily.   Multiple Vitamin (MULTI-VITAMIN) tablet Take 1 tablet by mouth daily. Unknown strenght   nystatin (MYCOSTATIN/NYSTOP) powder Apply 1 application topically as needed for rash.   omeprazole (PRILOSEC) 40 MG capsule Take 40 mg by mouth daily.   verapamil (CALAN-SR) 120 MG CR tablet Take 1 tablet (120 mg total) by  mouth 2 (two) times daily.     Allergies:   Penicillin g, Lisinopril, and Pravastatin   Social History   Socioeconomic History   Marital status: Married    Spouse name: Not on file   Number of children: Not on file   Years of education: Not on file   Highest education level: Not on file  Occupational History   Not on file  Tobacco Use   Smoking status: Never   Smokeless tobacco: Never  Substance and Sexual Activity   Alcohol use: Never   Drug use: Never   Sexual activity: Not Currently  Other Topics Concern   Not on file  Social History Narrative   Not on file   Social Determinants of Health   Financial Resource  Strain: Not on file  Food Insecurity: Not on file  Transportation Needs: Not on file  Physical Activity: Not on file  Stress: Not on file  Social Connections: Not on file     Family History: The patient's family history includes Heart failure in her mother. ROS:   Please see the history of present illness.    All other systems reviewed and are negative.  EKGs/Labs/Other Studies Reviewed:    The following studies were reviewed today:   Physical Exam:    VS:  BP 136/80 (BP Location: Left Wrist, Patient Position: Sitting)   Pulse 76   Ht 5\' 2"  (1.575 m)   Wt 290 lb 12.8 oz (131.9 kg)   SpO2 98%   BMI 53.19 kg/m     Wt Readings from Last 3 Encounters:  06/13/22 290 lb 12.8 oz (131.9 kg)  10/22/21 292 lb 9.6 oz (132.7 kg)     GEN: Marked obesity BMI exceeds 50 well nourished, well developed in no acute distress HEENT: Normal NECK: No JVD; No carotid bruits LYMPHATICS: No lymphadenopathy CARDIAC: RRR, no murmurs, rubs, gallops RESPIRATORY:  Clear to auscultation without rales, wheezing or rhonchi  ABDOMEN: Soft, non-tender, non-distended MUSCULOSKELETAL:  No edema; No deformity  SKIN: Warm and dry NEUROLOGIC:  Alert and oriented x 3 PSYCHIATRIC:  Normal affect    Signed, 10/24/21, MD  06/13/2022 3:30 PM    [facility]

## 2022-06-13 ENCOUNTER — Ambulatory Visit: Payer: Medicare Other | Attending: Cardiology | Admitting: Cardiology

## 2022-06-13 ENCOUNTER — Encounter: Payer: Self-pay | Admitting: Cardiology

## 2022-06-13 VITALS — BP 136/80 | HR 76 | Ht 62.0 in | Wt 290.8 lb

## 2022-06-13 DIAGNOSIS — I4821 Permanent atrial fibrillation: Secondary | ICD-10-CM

## 2022-06-13 DIAGNOSIS — Z7901 Long term (current) use of anticoagulants: Secondary | ICD-10-CM | POA: Diagnosis present

## 2022-06-13 DIAGNOSIS — I119 Hypertensive heart disease without heart failure: Secondary | ICD-10-CM

## 2022-06-13 DIAGNOSIS — I350 Nonrheumatic aortic (valve) stenosis: Secondary | ICD-10-CM

## 2022-06-13 DIAGNOSIS — Q231 Congenital insufficiency of aortic valve: Secondary | ICD-10-CM | POA: Diagnosis present

## 2022-06-13 DIAGNOSIS — I340 Nonrheumatic mitral (valve) insufficiency: Secondary | ICD-10-CM | POA: Diagnosis present

## 2022-06-13 DIAGNOSIS — Q2381 Bicuspid aortic valve: Secondary | ICD-10-CM

## 2022-06-13 DIAGNOSIS — I951 Orthostatic hypotension: Secondary | ICD-10-CM | POA: Diagnosis present

## 2022-06-13 DIAGNOSIS — R0602 Shortness of breath: Secondary | ICD-10-CM

## 2022-06-13 DIAGNOSIS — R079 Chest pain, unspecified: Secondary | ICD-10-CM | POA: Diagnosis present

## 2022-06-13 NOTE — Patient Instructions (Signed)
Medication Instructions:  Your physician recommends that you continue on your current medications as directed. Please refer to the Current Medication list given to you today.  *If you need a refill on your cardiac medications before your next appointment, please call your pharmacy*   Lab Work: None If you have labs (blood work) drawn today and your tests are completely normal, you will receive your results only by: [place] (if you have MyChart) OR A paper copy in the mail If you have any lab test that is abnormal or we need to change your treatment, we will call you to review the results.   Testing/Procedures: Your physician has requested that you have an echocardiogram. Echocardiography is a painless test that uses sound waves to create images of your heart. It provides your doctor with information about the size and shape of your heart and how well your heart's chambers and valves are working. This procedure takes approximately one hour. There are no restrictions for this procedure.    [facility] Nuclear Imaging [address] [place], [place] [address] Phone:  [phone]    Please arrive 15 minutes prior to your appointment time for registration and insurance purposes.  The test will take approximately 3 to 4 hours to complete; you may bring reading material.  If someone comes with you to your appointment, they will need to remain in the main lobby due to limited space in the testing area. **If you are pregnant or breastfeeding, please notify the nuclear lab prior to your appointment**  How to prepare for your Myocardial Perfusion Test: Do not eat or drink 3 hours prior to your test, except you may have water. Do not consume products containing caffeine (regular or decaffeinated) 12 hours prior to your test. (ex: coffee, chocolate, sodas, tea). Do bring a list of your current medications with you.  If not listed below, you may take your medications as  normal. Do wear comfortable clothes (no dresses or overalls) and walking shoes, tennis shoes preferred (No heels or open toe shoes are allowed). Do NOT wear cologne, perfume, aftershave, or lotions (deodorant is allowed). If these instructions are not followed, your test will have to be rescheduled.  Please report to [address] for your test.  If you have questions or concerns about your appointment, you can call the Portal Nuclear Imaging Lab at [phone].  If you cannot keep your appointment, please provide 24 hours notification to the Nuclear Lab, to avoid a possible $50 charge to your account.    Follow-Up: At [facility], you and your health needs are our priority.  As part of our continuing mission to provide you with exceptional heart care, we have created designated Provider Care Teams.  These Care Teams include your primary Cardiologist (physician) and Advanced Practice Providers (APPs -  Physician Assistants and Nurse Practitioners) who all work together to provide you with the care you need, when you need it.  We recommend signing up for the patient portal called "MyChart".  Sign up information is provided on this After Visit Summary.  MyChart is used to connect with patients for Virtual Visits (Telemedicine).  Patients are able to view lab/test results, encounter notes, upcoming appointments, etc.  Non-urgent messages can be sent to your provider as well.   To learn more about what you can do with MyChart, go to [email or web address].    Your next appointment:   3 month(s)  The format for your next appointment:  In Person  Provider:   Shirlee More, MD    Other Instructions None  Important Information About Sugar

## 2022-06-14 ENCOUNTER — Telehealth (HOSPITAL_COMMUNITY): Payer: Self-pay | Admitting: *Deleted

## 2022-06-14 NOTE — Telephone Encounter (Signed)
Left message on voicemail per DPR in reference to upcoming appointment scheduled on  06/21/22 with detailed instructions given per Myocardial Perfusion Study Information Sheet for the test. LM to arrive 15 minutes early, and that it is imperative to arrive on time for appointment to keep from having the test rescheduled. If you need to cancel or reschedule your appointment, please call the office within 24 hours of your appointment. Failure to do so may result in a cancellation of your appointment, and a $50 no show fee. Phone number given for call back for any questions. Jen Benedict Jacqueline   

## 2022-06-21 ENCOUNTER — Ambulatory Visit: Payer: Medicare Other | Attending: Cardiology

## 2022-06-21 DIAGNOSIS — R079 Chest pain, unspecified: Secondary | ICD-10-CM | POA: Insufficient documentation

## 2022-06-21 MED ORDER — REGADENOSON 0.4 MG/5ML IV SOLN
0.4000 mg | Freq: Once | INTRAVENOUS | Status: AC
  Administered 2022-06-21: 0.4 mg via INTRAVENOUS

## 2022-06-21 MED ORDER — TECHNETIUM TC 99M TETROFOSMIN IV KIT
31.8000 | PACK | Freq: Once | INTRAVENOUS | Status: AC | PRN
  Administered 2022-06-21: 31.8 via INTRAVENOUS

## 2022-06-22 ENCOUNTER — Ambulatory Visit: Payer: Medicare Other | Attending: Cardiology

## 2022-06-22 LAB — MYOCARDIAL PERFUSION IMAGING
LV dias vol: 106 mL (ref 46–106)
LV sys vol: 47 mL
Nuc Stress EF: 55 %
Peak HR: 109 {beats}/min
Rest HR: 94 {beats}/min
Rest Nuclear Isotope Dose: 30.6 mCi
SDS: 3
SRS: 3
SSS: 6
Stress Nuclear Isotope Dose: 31.8 mCi
TID: 0.96

## 2022-06-22 MED ORDER — TECHNETIUM TC 99M TETROFOSMIN IV KIT
30.6000 | PACK | Freq: Once | INTRAVENOUS | Status: AC | PRN
  Administered 2022-06-22: 30.6 via INTRAVENOUS

## 2022-06-23 ENCOUNTER — Ambulatory Visit: Payer: Medicare Other | Attending: Cardiology

## 2022-06-23 ENCOUNTER — Telehealth: Payer: Self-pay | Admitting: Cardiology

## 2022-06-23 DIAGNOSIS — I350 Nonrheumatic aortic (valve) stenosis: Secondary | ICD-10-CM

## 2022-06-23 DIAGNOSIS — R079 Chest pain, unspecified: Secondary | ICD-10-CM | POA: Diagnosis present

## 2022-06-23 DIAGNOSIS — I951 Orthostatic hypotension: Secondary | ICD-10-CM

## 2022-06-23 DIAGNOSIS — Z7901 Long term (current) use of anticoagulants: Secondary | ICD-10-CM

## 2022-06-23 DIAGNOSIS — I340 Nonrheumatic mitral (valve) insufficiency: Secondary | ICD-10-CM

## 2022-06-23 DIAGNOSIS — I4821 Permanent atrial fibrillation: Secondary | ICD-10-CM

## 2022-06-23 DIAGNOSIS — R0602 Shortness of breath: Secondary | ICD-10-CM

## 2022-06-23 DIAGNOSIS — I119 Hypertensive heart disease without heart failure: Secondary | ICD-10-CM

## 2022-06-23 DIAGNOSIS — Q231 Congenital insufficiency of aortic valve: Secondary | ICD-10-CM | POA: Diagnosis present

## 2022-06-23 DIAGNOSIS — Q2381 Bicuspid aortic valve: Secondary | ICD-10-CM

## 2022-06-23 LAB — ECHOCARDIOGRAM COMPLETE
AR max vel: 0.96 cm2
AV Area VTI: 1.01 cm2
AV Area mean vel: 0.91 cm2
AV Mean grad: 14 mmHg
AV Peak grad: 21.9 mmHg
Ao pk vel: 2.34 m/s
Area-P 1/2: 2.95 cm2
MV M vel: 4.44 m/s
MV Peak grad: 78.9 mmHg
MV VTI: 1.2 cm2
Radius: 0.2 cm
S' Lateral: 3.1 cm

## 2022-06-23 NOTE — Telephone Encounter (Signed)
Richardo Priest, MD  06/23/2022  8:28 AM EDT     Good result this is normal   Reviewed recommendations per Dr. Joya Gaskins note.  She thanked me for the call and had no additional questions.

## 2022-06-23 NOTE — Telephone Encounter (Signed)
Patient returning call for stress test results. She requests the call go to her cell phone.

## 2022-06-28 ENCOUNTER — Telehealth: Payer: Self-pay | Admitting: Cardiology

## 2022-06-28 NOTE — Telephone Encounter (Signed)
Left message for the patient to call back.

## 2022-06-28 NOTE — Telephone Encounter (Signed)
Patient calling for echo results 

## 2022-06-29 NOTE — Telephone Encounter (Signed)
Patient informed of results.  

## 2022-09-13 ENCOUNTER — Ambulatory Visit: Payer: Medicare Other | Admitting: Cardiology

## 2022-10-26 NOTE — Progress Notes (Deleted)
Cardiology Office Note:    Date:  10/26/2022   ID:  Erika Yang, DOB August 14, 1946, MRN [id number]  PCP:  Maris Berger, MD  Cardiologist:  Shirlee More, MD    Referring MD: Maris Berger, MD    ASSESSMENT:    No diagnosis found. PLAN:    In order of problems listed above:  ***   Next appointment: ***   Medication Adjustments/Labs and Tests Ordered: Current medicines are reviewed at length with the patient today.  Concerns regarding medicines are outlined above.  No orders of the defined types were placed in this encounter.  No orders of the defined types were placed in this encounter.   No chief complaint on file.   History of Present Illness:    Erika Yang is a 77 y.o. female with a hx of longstanding persistent atrial fibrillation with chronic anticoagulation hypertensive heart disease without heart failure postural hypotension type 2 diabetes and a bicuspid aortic valve with mild aortic stenosis last seen 06/13/2022. Echocardiogram at [facility] July 2022 showed normal right ventricular size function wall thickness systolic function left atrium was moderately enlarged aortic valve was described as bicuspid left atrium is with mild aortic stenosis moderate mitral and mild tricuspid regurgitation.   She had an echocardiogram 06/23/2022 which showed a tricuspid aortic valve mild calcification trivial regurgitation and mild aortic stenosis. 1. Left ventricular ejection fraction, by estimation, is 55 to 60%. Left  ventricular ejection fraction by PLAX is 65 %. The left ventricle has  normal function. The left ventricle has no regional wall motion  abnormalities. Left ventricular diastolic  parameters are indeterminate. The average left ventricular global  longitudinal strain is -13.7 %.   2. Right ventricular systolic function is normal. The right ventricular  size is normal. There is mildly elevated pulmonary artery systolic  pressure.   3. The mitral  valve is degenerative. Mild mitral valve regurgitation. No  evidence of mitral stenosis.   4. The aortic valve is tricuspid. There is mild calcification of the  aortic valve. There is mild thickening of the aortic valve. Aortic valve  regurgitation is trivial. Mild aortic valve stenosis.   5. Aortic Normal DTA.   6. The inferior vena cava is dilated in size with <50% respiratory  variability, suggesting right atrial pressure of 15 mmHg.  Myocardial perfusion study was performed on 06/21/2022.  It was low risk the ejection fraction is normal at 55% with normal myocardial function and perfusion. Study Highlights      The study is normal. The study is low risk.   Left ventricular function is normal. Nuclear stress EF: 55 %. The left ventricular ejection fraction is normal (55-65%). End diastolic cavity size is normal.   Prior study not available for comparison. Compliance with diet, lifestyle and medications: *** Past Medical History:  Diagnosis Date   Acute foot pain, left 01/20/2020   Anticoagulated by anticoagulation treatment 10/05/2020   Last Assessment & Plan:  Formatting of this note might be different from the original. Continue Eliquis.   Arthritis 04/18/2012   Back pain 04/18/2012   BMI 50.0-59.9, adult ([place]) 12/17/2015   Caregiver stress 07/19/2021   Caregiver with fatigue 03/09/2016   Class 3 severe obesity due to excess calories with serious comorbidity and body mass index (BMI) of 50.0 to 59.9 in adult [facility]) 10/05/2020   Last Assessment & Plan:  Formatting of this note might be different from the original. This is a major issue.  Getting around  slowly but steadily with rolling walker.  Still driving and caring for her mother.  It is stressful.   Compression fracture of L4 vertebra (HCC) 11/24/2020   Constipation, chronic 12/17/2015   Current use of long term anticoagulation 11/24/2020   Degenerative lumbar disc 12/01/2015   Diabetes mellitus type 2 in obese ([place]) 01/25/2019   Drug therapy  12/01/2015   Edema 12/17/2015   Elevated blood pressure reading in office with diagnosis of hypertension 01/19/2021   Essential (primary) hypertension 04/18/2012   Last Assessment & Plan:  Formatting of this note might be different from the original. Blood pressure under good control.  Continue with candesartan 16 mg a day, HCTZ 25 mg a day and verapamil.   Gait disturbance 01/24/2019   GERD without esophagitis 12/08/2015   Hematochezia 01/31/2019   Hematuria 12/17/2015   Hyperlipemia 04/18/2012   Hypotension, postural 09/01/2021   Incisional hernia 10/19/2021   Intertrigo 12/01/2015   Meralgia paresthetica of left side 12/01/2015   Occult blood positive stool 01/31/2019   OSA on CPAP 04/18/2012   Osteoarthrosis, localized, primary, knee 12/01/2015   Overgrown toenails 10/29/2019   Paroxysmal atrial fibrillation ([place]) 10/02/2020   Last Assessment & Plan:  Formatting of this note might be different from the original. Asymptomatic and rate controlled.  Continue with verapamil SR 120 b.i.d. and Eliquis.  No bleeding or falls reported.   Pharyngoesophageal dysphagia 12/08/2015   Postural dizziness with presyncope 10/02/2020   Pre-syncope 10/02/2020   Shortness of breath 04/18/2012   Situational anxiety 10/02/2020   Statin intolerance 07/17/2019   Urinary incontinence 04/18/2012   Venous insufficiency 12/17/2015   Ventral hernia 12/01/2015    Past Surgical History:  Procedure Laterality Date   APPENDECTOMY     cyst removal from ovaries     PARTIAL HYSTERECTOMY      Current Medications: No outpatient medications have been marked as taking for the 10/27/22 encounter (Appointment) with Richardo Priest, MD.     Allergies:   Penicillin g, Lisinopril, and Pravastatin   Social History   Socioeconomic History   Marital status: Married    Spouse name: Not on file   Number of children: Not on file   Years of education: Not on file   Highest education level: Not on file  Occupational History   Not on file  Tobacco Use    Smoking status: Never   Smokeless tobacco: Never  Substance and Sexual Activity   Alcohol use: Never   Drug use: Never   Sexual activity: Not Currently  Other Topics Concern   Not on file  Social History Narrative   Not on file   Social Determinants of Health   Financial Resource Strain: Not on file  Food Insecurity: Not on file  Transportation Needs: Not on file  Physical Activity: Not on file  Stress: Not on file  Social Connections: Not on file     Family History: The patient's ***family history includes Heart failure in her mother. ROS:   Please see the history of present illness.    All other systems reviewed and are negative.  EKGs/Labs/Other Studies Reviewed:    The following studies were reviewed today:  EKG:  EKG ordered today and personally reviewed.  The ekg ordered today demonstrates ***  Recent Labs: No results found for requested labs within last 365 days.  Recent Lipid Panel No results found for: "CHOL", "TRIG", "HDL", "CHOLHDL", "VLDL", "LDLCALC", "LDLDIRECT"  Physical Exam:    VS:  There were no  vitals taken for this visit.    Wt Readings from Last 3 Encounters:  06/21/22 290 lb (131.5 kg)  06/13/22 290 lb 12.8 oz (131.9 kg)  10/22/21 292 lb 9.6 oz (132.7 kg)     GEN: *** Well nourished, well developed in no acute distress HEENT: Normal NECK: No JVD; No carotid bruits LYMPHATICS: No lymphadenopathy CARDIAC: ***RRR, no murmurs, rubs, gallops RESPIRATORY:  Clear to auscultation without rales, wheezing or rhonchi  ABDOMEN: Soft, non-tender, non-distended MUSCULOSKELETAL:  No edema; No deformity  SKIN: Warm and dry NEUROLOGIC:  Alert and oriented x 3 PSYCHIATRIC:  Normal affect    Signed, Shirlee More, MD  10/26/2022 5:51 PM    [facility]

## 2022-10-27 ENCOUNTER — Ambulatory Visit: Payer: Medicare Other | Admitting: Cardiology

## 2022-12-25 NOTE — Progress Notes (Unsigned)
Cardiology Office Note:    Date:  12/26/2022   ID:  Erika Yang, DOB 1946/07/25, MRN [id number]  PCP:  Maris Berger, MD  Cardiologist:  Shirlee More, MD    Referring MD: Maris Berger, MD    ASSESSMENT:    1. Permanent atrial fibrillation   2. Chronic anticoagulation   3. Hypertensive heart disease without heart failure   4. Nonrheumatic aortic valve stenosis   5. Essential (primary) hypertension   6. Hypotension, postural   7. SOB (shortness of breath)    PLAN:    In order of problems listed above:  Rate controlled continue calcium channel blocker and current anticoagulant I am concerned she has developed heart failure not uncommon with atrial fibrillation we will check a proBNP level and start her on low-dose loop diuretic.  I chose torsemide as she is concerned about urinary incontinence and frequency continue calcium channel blocker ARB Stable aortic stenosis mild valve is not bicuspid she has calcific aortic stenosis and has not progressed No recurrent episodes of hypotension   Next appointment: 3 months   Medication Adjustments/Labs and Tests Ordered: Current medicines are reviewed at length with the patient today.  Concerns regarding medicines are outlined above.  Orders Placed This Encounter  Procedures   Basic Metabolic Panel (BMET)   Pro b natriuretic peptide (BNP)   EKG 12-Lead   No orders of the defined types were placed in this encounter.   Chief complaint I am increasingly short of breath and having edema   History of Present Illness:    Erika Yang is a 77 y.o. female with a hx of permanent atrial fibrillation with chronic anticoagulation hypertensive heart disease postural hypotension type 2 diabetes and a bicuspid aortic valve with mild aortic stenosis last seen  06/13/2022 for shortness of breath  Further evaluation include an echocardiogram reported 06/23/2022 left ventricle normal in size wall thickness EF 55 to 60% indeterminate  diastolic parameters normal right ventricular size function with mild elevation of the pulmonary artery systolic pressure and mild aortic stenosis mean gradient 14 mmHg.  Aortic valve is described as tricuspid with mild calcification  Myocardial perfusion study 06/13/2022 showed normal left ventricular function and described as low risk with no ischemia.  Her mother passed this morning after a long illness in skilled nursing She had a recent sleep study performed is awaiting results She continues to have shortness of breath with minor activity but is not having orthopnea or PND with CPAP and notices increased lower extremity edema.  She is at risk for heart failure with atrial fibrillation  Past Medical History:  Diagnosis Date   Acute foot pain, left 01/20/2020   Anticoagulated by anticoagulation treatment 10/05/2020   Last Assessment & Plan:  Formatting of this note might be different from the original. Continue Eliquis.   Arthritis 04/18/2012   Back pain 04/18/2012   BMI 50.0-59.9, adult 12/17/2015   Caregiver stress 07/19/2021   Caregiver with fatigue 03/09/2016   Class 3 severe obesity due to excess calories with serious comorbidity and body mass index (BMI) of 50.0 to 59.9 in adult 10/05/2020   Last Assessment & Plan:  Formatting of this note might be different from the original. This is a major issue.  Getting around slowly but steadily with rolling walker.  Still driving and caring for her mother.  It is stressful.   Compression fracture of L4 vertebra 11/24/2020   Constipation, chronic 12/17/2015   Current use of long term anticoagulation  11/24/2020   Degenerative lumbar disc 12/01/2015   Diabetes mellitus type 2 in obese 01/25/2019   Drug therapy 12/01/2015   Edema 12/17/2015   Elevated blood pressure reading in office with diagnosis of hypertension 01/19/2021   Essential (primary) hypertension 04/18/2012   Last Assessment & Plan:  Formatting of this note might be different from the original. Blood  pressure under good control.  Continue with candesartan 16 mg a day, HCTZ 25 mg a day and verapamil.   Gait disturbance 01/24/2019   GERD without esophagitis 12/08/2015   Hematochezia 01/31/2019   Hematuria 12/17/2015   Hyperlipemia 04/18/2012   Hypotension, postural 09/01/2021   Incisional hernia 10/19/2021   Intertrigo 12/01/2015   Meralgia paresthetica of left side 12/01/2015   Occult blood positive stool 01/31/2019   OSA on CPAP 04/18/2012   Osteoarthrosis, localized, primary, knee 12/01/2015   Overgrown toenails 10/29/2019   Paroxysmal atrial fibrillation 10/02/2020   Last Assessment & Plan:  Formatting of this note might be different from the original. Asymptomatic and rate controlled.  Continue with verapamil SR 120 b.i.d. and Eliquis.  No bleeding or falls reported.   Pharyngoesophageal dysphagia 12/08/2015   Postural dizziness with presyncope 10/02/2020   Pre-syncope 10/02/2020   Shortness of breath 04/18/2012   Situational anxiety 10/02/2020   Statin intolerance 07/17/2019   Urinary incontinence 04/18/2012   Venous insufficiency 12/17/2015   Ventral hernia 12/01/2015    Past Surgical History:  Procedure Laterality Date   APPENDECTOMY     cyst removal from ovaries     PARTIAL HYSTERECTOMY      Current Medications: Current Meds  Medication Sig   Calcium Citrate-Vitamin D 315-5 MG-MCG TABS Take 1 tablet by mouth daily.   candesartan (ATACAND) 4 MG tablet Take 4 mg by mouth daily.   Docusate Sodium (STOOL SOFTENER LAXATIVE PO) Take 1 tablet by mouth as needed (constipation).   ELIQUIS 5 MG TABS tablet Take 1 tablet (5 mg total) by mouth 2 (two) times daily.   metFORMIN (GLUCOPHAGE-XR) 500 MG 24 hr tablet Take 500 mg by mouth daily.   Multiple Vitamin (MULTI-VITAMIN) tablet Take 1 tablet by mouth daily. Unknown strenght   nystatin (MYCOSTATIN/NYSTOP) powder Apply 1 application topically as needed for rash.   omeprazole (PRILOSEC) 40 MG capsule Take 40 mg by mouth daily.   verapamil (CALAN-SR) 120 MG  CR tablet Take 1 tablet (120 mg total) by mouth 2 (two) times daily.     Allergies:   Penicillin g, Lisinopril, and Pravastatin   Social History   Socioeconomic History   Marital status: Married    Spouse name: Not on file   Number of children: Not on file   Years of education: Not on file   Highest education level: Not on file  Occupational History   Not on file  Tobacco Use   Smoking status: Never   Smokeless tobacco: Never  Substance and Sexual Activity   Alcohol use: Never   Drug use: Never   Sexual activity: Not Currently  Other Topics Concern   Not on file  Social History Narrative   Not on file   Social Determinants of Health   Financial Resource Strain: Not on file  Food Insecurity: Not on file  Transportation Needs: Not on file  Physical Activity: Not on file  Stress: Not on file  Social Connections: Not on file     Family History: The patient's family history includes Heart failure (age of onset: 73) in her mother; Pneumonia in  her mother. ROS:   Please see the history of present illness.    All other systems reviewed and are negative.  EKGs/Labs/Other Studies Reviewed:    The following studies were reviewed today:  Cardiac Studies & Procedures     STRESS TESTS  MYOCARDIAL PERFUSION IMAGING 06/22/2022  Narrative   The study is normal. The study is low risk.   Left ventricular function is normal. Nuclear stress EF: 55 %. The left ventricular ejection fraction is normal (55-65%). End diastolic cavity size is normal.   Prior study not available for comparison.   ECHOCARDIOGRAM  ECHOCARDIOGRAM COMPLETE 06/23/2022  Narrative ECHOCARDIOGRAM REPORT    Patient Name:   LARIN GRAVETT Date of Exam: 06/23/2022 Medical Rec #:  [id number]       Height:       62.0 in Accession #:    [id number]      Weight:       290.0 lb Date of Birth:  06/20/1946       BSA:          2.239 m Patient Age:    40 years        BP:           136/80 mmHg Patient Gender: F                HR:           82 bpm. Exam Location:  [facility]  Procedure: 2D Echo, Cardiac Doppler, Color Doppler and Strain Analysis  Indications:    Permanent atrial fibrillation (HCC) [I48.21 (ICD-10-CM)]; Chronic anticoagulation [Z79.01 (ICD-10-CM)]; Hypertensive heart disease without heart failure [I11.9 (ICD-10-CM)]; Hypotension, postural [I95.1 (ICD-10-CM)]; Bicuspid aortic valve [Q23.1 (ICD-10-CM)]; Nonrheumatic aortic valve stenosis [I35.0 (ICD-10-CM)]; Nonrheumatic mitral valve regurgitation [I34.0 (ICD-10-CM)]; SOB (shortness of breath) [R06.02 (ICD-10-CM)]; Chest pain, unspecified type [R07.9 (ICD-10-CM)]  History:        Patient has no prior history of Echocardiogram examinations. Aortic Valve Disease, Arrythmias:Atrial Fibrillation, Signs/Symptoms:Chest Pain and Dyspnea; Risk Factors:Hypertension.  Sonographer:    Philipp Deputy RDCS Referring Phys: [id number] Diamond Martucci J Bettina Gavia   Sonographer Comments: Global longitudinal strain was attempted. Atrial fibrillation controlled rate IMPRESSIONS   1. Left ventricular ejection fraction, by estimation, is 55 to 60%. Left ventricular ejection fraction by PLAX is 65 %. The left ventricle has normal function. The left ventricle has no regional wall motion abnormalities. Left ventricular diastolic parameters are indeterminate. The average left ventricular global longitudinal strain is -13.7 %. 2. Right ventricular systolic function is normal. The right ventricular size is normal. There is mildly elevated pulmonary artery systolic pressure. 3. The mitral valve is degenerative. Mild mitral valve regurgitation. No evidence of mitral stenosis. 4. The aortic valve is tricuspid. There is mild calcification of the aortic valve. There is mild thickening of the aortic valve. Aortic valve regurgitation is trivial. Mild aortic valve stenosis. 5. Aortic Normal DTA. 6. The inferior vena cava is dilated in size with <50% respiratory variability,  suggesting right atrial pressure of 15 mmHg.  FINDINGS Left Ventricle: Left ventricular ejection fraction, by estimation, is 55 to 60%. Left ventricular ejection fraction by PLAX is 65 %. The left ventricle has normal function. The left ventricle has no regional wall motion abnormalities. The average left ventricular global longitudinal strain is -13.7 %. The left ventricular internal cavity size was normal in size. There is no left ventricular hypertrophy. Left ventricular diastolic parameters are indeterminate. Indeterminate filling pressures.  Right Ventricle: The right ventricular size is normal. No increase  in right ventricular wall thickness. Right ventricular systolic function is normal. There is mildly elevated pulmonary artery systolic pressure. The tricuspid regurgitant velocity is 2.71 m/s, and with an assumed right atrial pressure of 15 mmHg, the estimated right ventricular systolic pressure is [id number] mmHg.  Left Atrium: Left atrial size was normal in size.  Right Atrium: Right atrial size was normal in size.  Pericardium: There is no evidence of pericardial effusion.  Mitral Valve: The mitral valve is degenerative in appearance. There is mild thickening of the anterior and posterior mitral valve leaflet(s). Mild mitral annular calcification. Mild mitral valve regurgitation. No evidence of mitral valve stenosis. MV peak gradient, 13.7 mmHg. The mean mitral valve gradient is 6.0 mmHg.  Tricuspid Valve: The tricuspid valve is normal in structure. Tricuspid valve regurgitation is mild . No evidence of tricuspid stenosis.  Aortic Valve: The aortic valve is tricuspid. There is mild calcification of the aortic valve. There is mild thickening of the aortic valve. Aortic valve regurgitation is trivial. Mild aortic stenosis is present. Aortic valve mean gradient measures 14.0 mmHg. Aortic valve peak gradient measures 21.9 mmHg. Aortic valve area, by VTI measures 1.01 cm.  Pulmonic Valve: The  pulmonic valve was normal in structure. Pulmonic valve regurgitation is not visualized. No evidence of pulmonic stenosis.  Aorta: The aortic arch was not well visualized, the aortic root and ascending aorta are structurally normal, with no evidence of dilitation and Normal DTA.  Venous: The pulmonary veins were not well visualized. The inferior vena cava is dilated in size with less than 50% respiratory variability, suggesting right atrial pressure of 15 mmHg.  IAS/Shunts: No atrial level shunt detected by color flow Doppler.   LEFT VENTRICLE PLAX 2D LV EF:         Left            Diastology ventricular     LV e' medial:    8.16 cm/s ejection        LV E/e' medial:  22.1 fraction by     LV e' lateral:   13.60 cm/s PLAX is 65      LV E/e' lateral: 13.2 %. LVIDd:         4.80 cm         2D LVIDs:         3.10 cm         Longitudinal LV PW:         0.90 cm         Strain LV IVS:        0.90 cm         2D Strain GLS  -13.7 % LVOT diam:     1.70 cm         Avg: LV SV:         51 LV SV Index:   23 LVOT Area:     2.27 cm   RIGHT VENTRICLE             IVC RV Basal diam:  3.15 cm     IVC diam: 2.60 cm RV Mid diam:    2.80 cm RV S prime:     10.80 cm/s TAPSE (M-mode): 2.7 cm  LEFT ATRIUM             Index        RIGHT ATRIUM           Index LA diam:        4.70 cm 2.10 cm/m  RA Area:     21.80 cm LA Vol (A2C):   55.0 ml 24.57 ml/m  RA Volume:   62.30 ml  27.83 ml/m LA Vol (A4C):   82.5 ml 36.85 ml/m LA Biplane Vol: 67.5 ml 30.15 ml/m AORTIC VALVE AV Area (Vmax):    0.96 cm AV Area (Vmean):   0.91 cm AV Area (VTI):     1.01 cm AV Vmax:           233.75 cm/s AV Vmean:          176.750 cm/s AV VTI:            0.509 m AV Peak Grad:      21.9 mmHg AV Mean Grad:      14.0 mmHg LVOT Vmax:         99.32 cm/s LVOT Vmean:        70.940 cm/s LVOT VTI:          0.226 m LVOT/AV VTI ratio: 0.44  AORTA Ao Root diam: 2.70 cm Ao Asc diam:  2.80 cm Ao Desc diam: 1.70  cm  MITRAL VALVE                  TRICUSPID VALVE MV Area (PHT): 2.95 cm       TR Peak grad:   29.4 mmHg MV Area VTI:   1.20 cm       TR Vmax:        271.00 cm/s MV Peak grad:  13.7 mmHg MV Mean grad:  6.0 mmHg       SHUNTS MV Vmax:       1.85 m/s       Systemic VTI:  0.23 m MV Vmean:      114.0 cm/s     Systemic Diam: 1.70 cm MV Decel Time: 257 msec MR Peak grad:    78.9 mmHg MR Mean grad:    55.0 mmHg MR Vmax:         444.00 cm/s MR Vmean:        355.0 cm/s MR PISA:         0.25 cm MR PISA Eff ROA: 2 mm MR PISA Radius:  0.20 cm MV E velocity: 180.00 cm/s  Shirlee More MD Electronically signed by Shirlee More MD Signature Date/Time: 06/23/2022/5:26:06 PM    Final    MONITORS  LONG TERM MONITOR (3-14 DAYS) 09/17/2021  Narrative Patch Wear Time:  6 days and 20 hours (2022-12-09T10:08:50-0500 to 2022-12-16T07:08:24-0500)  Atrial Fibrillation occurred continuously (100% burden), ranging from 51-166 bpm (avg of 89 bpm). Isolated VEs were rare (<1.0%), VE Couplets were rare (<1.0%), and no VE Triplets were present. Ventricular Bigeminy and Trigeminy were present.  Cardiac rhythm was atrial fibrillation throughout with an average heart rate of 89 bpm Heart rate distribution daytime 50 to 110 bpm 78% of the time nighttime 97%.  There were no sustained rhythms less than 50 bpm. Ventricular ectopy was rare.  Conclusion atrial fibrillation controlled ventricular rate.            Recent Labs: Recent labs 08/29/2022 [facility] PCP Hemoglobin 13.9 platelets 150,000 Normal CMP except for glucose 108 potassium 4.1 creatinine 0.74 01/17/2022 cholesterol 194 LDL 124 Physical Exam:    VS:  BP 127/81 (BP Location: Left Wrist, Patient Position: Sitting)   Pulse 100   Ht 5\' 2"  (1.575 m)   Wt 293 lb 6.4 oz (133.1 kg)   SpO2 95%   BMI 53.66 kg/m  Wt Readings from Last 3 Encounters:  12/26/22 293 lb 6.4 oz (133.1 kg)  06/21/22 290 lb (131.5 kg)  06/13/22 290 lb  12.8 oz (131.9 kg)     GEN: Obese well nourished, well developed in no acute distress HEENT: Normal NECK: No JVD; No carotid bruits LYMPHATICS: No lymphadenopathy CARDIAC: Irregular rate and rhythm no gallop or murmur  RESPIRATORY:  Clear to auscultation without rales, wheezing or rhonchi  ABDOMEN: Soft, non-tender, non-distended MUSCULOSKELETAL: She has bilateral lower extremity pitting edema above the knee bilateral edema; No deformity  SKIN: Warm and dry NEUROLOGIC:  Alert and oriented x 3 PSYCHIATRIC:  Normal affect    Signed, Shirlee More, MD  12/26/2022 3:28 PM    [facility]

## 2022-12-26 ENCOUNTER — Other Ambulatory Visit: Payer: Self-pay

## 2022-12-26 ENCOUNTER — Encounter: Payer: Self-pay | Admitting: Cardiology

## 2022-12-26 ENCOUNTER — Ambulatory Visit: Payer: Medicare Other | Attending: Cardiology | Admitting: Cardiology

## 2022-12-26 VITALS — BP 127/81 | HR 100 | Ht 62.0 in | Wt 293.4 lb

## 2022-12-26 DIAGNOSIS — I119 Hypertensive heart disease without heart failure: Secondary | ICD-10-CM

## 2022-12-26 DIAGNOSIS — I10 Essential (primary) hypertension: Secondary | ICD-10-CM | POA: Diagnosis present

## 2022-12-26 DIAGNOSIS — I4821 Permanent atrial fibrillation: Secondary | ICD-10-CM | POA: Insufficient documentation

## 2022-12-26 DIAGNOSIS — R0602 Shortness of breath: Secondary | ICD-10-CM | POA: Insufficient documentation

## 2022-12-26 DIAGNOSIS — I951 Orthostatic hypotension: Secondary | ICD-10-CM | POA: Insufficient documentation

## 2022-12-26 DIAGNOSIS — I350 Nonrheumatic aortic (valve) stenosis: Secondary | ICD-10-CM | POA: Insufficient documentation

## 2022-12-26 DIAGNOSIS — Z7901 Long term (current) use of anticoagulants: Secondary | ICD-10-CM | POA: Diagnosis not present

## 2022-12-26 MED ORDER — TORSEMIDE 10 MG PO TABS: 10.0000 mg | ORAL_TABLET | Freq: Every day | ORAL | 0 refills | Status: DC

## 2022-12-26 NOTE — Patient Instructions (Signed)
Medication Instructions:  Your physician has recommended you make the following change in your medication:   START: Torsemide 10 mg daily  *If you need a refill on your cardiac medications before your next appointment, please call your pharmacy*   Lab Work: Your physician recommends that you return for lab work in:   Labs today: BMP, Pro BNP  If you have labs (blood work) drawn today and your tests are completely normal, you will receive your results only by: MyChart Message (if you have MyChart) OR A paper copy in the mail If you have any lab test that is abnormal or we need to change your treatment, we will call you to review the results.   Testing/Procedures: None   Follow-Up: At [facility], you and your health needs are our priority.  As part of our continuing mission to provide you with exceptional heart care, we have created designated Provider Care Teams.  These Care Teams include your primary Cardiologist (physician) and Advanced Practice Providers (APPs -  Physician Assistants and Nurse Practitioners) who all work together to provide you with the care you need, when you need it.  We recommend signing up for the patient portal called "MyChart".  Sign up information is provided on this After Visit Summary.  MyChart is used to connect with patients for Virtual Visits (Telemedicine).  Patients are able to view lab/test results, encounter notes, upcoming appointments, etc.  Non-urgent messages can be sent to your provider as well.   To learn more about what you can do with MyChart, go to [email or web address].    Your next appointment:   6 week(s)  Provider:   Venia Carbon, NP [facility])    Other Instructions None

## 2022-12-27 LAB — BASIC METABOLIC PANEL
BUN/Creatinine Ratio: 20 (ref 12–28)
BUN: 16 mg/dL (ref 8–27)
CO2: 24 mmol/L (ref 20–29)
Calcium: 9.5 mg/dL (ref 8.7–10.3)
Chloride: 108 mmol/L — ABNORMAL HIGH (ref 96–106)
Creatinine, Ser: 0.82 mg/dL (ref 0.57–1.00)
Glucose: 129 mg/dL — ABNORMAL HIGH (ref 70–99)
Potassium: 4.2 mmol/L (ref 3.5–5.2)
Sodium: 145 mmol/L — ABNORMAL HIGH (ref 134–144)
eGFR: 74 mL/min/{1.73_m2} (ref >59)

## 2022-12-27 LAB — PRO B NATRIURETIC PEPTIDE: NT-Pro BNP: 750 pg/mL — ABNORMAL HIGH (ref 0–738)

## 2023-01-13 ENCOUNTER — Telehealth: Payer: Self-pay | Admitting: Cardiology

## 2023-01-13 NOTE — Telephone Encounter (Signed)
Pt c/o medication issue:  1. Name of Medication:   torsemide (DEMADEX) 10 MG tablet    2. How are you currently taking this medication (dosage and times per day)? Take 1 tablet (10 mg total) by mouth daily.   3. Are you having a reaction (difficulty breathing--STAT)? No  4. What is your medication issue?  Pt states that medication is causing her to go to the bathroom constantly. To the point it's hard for her to leave the house. Pt would like to know if this is normal and can adjustments be made? Please advise

## 2023-01-18 ENCOUNTER — Other Ambulatory Visit: Payer: Self-pay | Admitting: Cardiology

## 2023-01-18 NOTE — Telephone Encounter (Signed)
Refill to pharmacy 

## 2023-01-19 NOTE — Telephone Encounter (Signed)
Patient notified

## 2023-02-06 ENCOUNTER — Encounter: Payer: Self-pay | Admitting: Cardiology

## 2023-02-06 ENCOUNTER — Ambulatory Visit: Payer: Medicare Other | Attending: Cardiology | Admitting: Cardiology

## 2023-02-06 VITALS — BP 117/67 | HR 78 | Ht 62.0 in | Wt 289.0 lb

## 2023-02-06 DIAGNOSIS — D6859 Other primary thrombophilia: Secondary | ICD-10-CM | POA: Diagnosis present

## 2023-02-06 DIAGNOSIS — I4821 Permanent atrial fibrillation: Secondary | ICD-10-CM

## 2023-02-06 DIAGNOSIS — I350 Nonrheumatic aortic (valve) stenosis: Secondary | ICD-10-CM

## 2023-02-06 DIAGNOSIS — Z7901 Long term (current) use of anticoagulants: Secondary | ICD-10-CM

## 2023-02-06 DIAGNOSIS — G4733 Obstructive sleep apnea (adult) (pediatric): Secondary | ICD-10-CM | POA: Diagnosis present

## 2023-02-06 DIAGNOSIS — R0602 Shortness of breath: Secondary | ICD-10-CM

## 2023-02-06 DIAGNOSIS — I340 Nonrheumatic mitral (valve) insufficiency: Secondary | ICD-10-CM | POA: Diagnosis present

## 2023-02-06 DIAGNOSIS — I10 Essential (primary) hypertension: Secondary | ICD-10-CM

## 2023-02-06 NOTE — Patient Instructions (Signed)
Medication Instructions:  Your physician has recommended you make the following change in your medication:  Change Torsemide to every other day  *If you need a refill on your cardiac medications before your next appointment, please call your pharmacy*   Lab Work: NONE If you have labs (blood work) drawn today and your tests are completely normal, you will receive your results only by: MyChart Message (if you have MyChart) OR A paper copy in the mail If you have any lab test that is abnormal or we need to change your treatment, we will call you to review the results.   Testing/Procedures: NONE   Follow-Up: At [facility], you and your health needs are our priority.  As part of our continuing mission to provide you with exceptional heart care, we have created designated Provider Care Teams.  These Care Teams include your primary Cardiologist (physician) and Advanced Practice Providers (APPs -  Physician Assistants and Nurse Practitioners) who all work together to provide you with the care you need, when you need it.  We recommend signing up for the patient portal called "MyChart".  Sign up information is provided on this After Visit Summary.  MyChart is used to connect with patients for Virtual Visits (Telemedicine).  Patients are able to view lab/test results, encounter notes, upcoming appointments, etc.  Non-urgent messages can be sent to your provider as well.   To learn more about what you can do with MyChart, go to [email or web address].    Your next appointment:   3 month(s)  Provider:   Norman Herrlich, MD    Other Instructions

## 2023-02-06 NOTE — Progress Notes (Signed)
Cardiology Office Note:    Date:  02/06/2023   ID:  Erika Yang, DOB [birth year], MRN [id number]  PCP:  Everlean Cherry, MD   [facility] Providers Cardiologist:  None     Referring MD: Everlean Cherry, MD   Chief Complaint  Patient presents with   Follow-up    History of Present Illness:    ILLIANA Yang is a 77 y.o. female with a hx of hypertension, atrial fibrillation, long-term use of anticoagulation, venous insufficiency, OSA on CPAP managed by neurology, GERD, DM 2, obesity, hyperlipidemia.  Long-term monitor in December 2022 revealed continuous atrial fibrillation 100% burden.  Lexiscan on 06/21/2022 was considered normal, low risk study, EF 55%.  Echocardiogram on 06/23/2022 revealed an EF 55 to 60%, diet systolic parameters were indeterminate, mildly elevated PASP, mild MR, mild calcification of the aortic valve, mild thickening of the aortic valve, mild AAS.  Most recently was evaluated by Dr. Dulce Sellar on 12/26/2022, at that time her A-fib was rate controlled, she was started on a low-dose of torsemide, to return in 3 months.  She presents today for follow-up of her hypertension and atrial fibrillation.  She states that she has been constantly tied to her house since starting her torsemide.  She has not noticed that it is made much difference in her energy, shortness of breath, lymphedema.  Her mother recently passed away, she has been busy and more stressed than normal dealing with her estate.  She has lymphedema as well has pedal edema, she tries to elevate them when possible however this is difficult to do.  She denies chest pain, palpitations, dyspnea, pnd, orthopnea, n, v, dizziness, syncope, weight gain, or early satiety.  Denies hematochezia, hematuria, hemoptysis.  Past Medical History:  Diagnosis Date   Acute foot pain, left 01/20/2020   Anticoagulated by anticoagulation treatment 10/05/2020   Last Assessment & Plan:  Formatting of this note might be  different from the original. Continue Eliquis.   Arthritis 04/18/2012   Back pain 04/18/2012   BMI 50.0-59.9, adult (HCC) 12/17/2015   Caregiver stress 07/19/2021   Caregiver with fatigue 03/09/2016   Class 3 severe obesity due to excess calories with serious comorbidity and body mass index (BMI) of 50.0 to 59.9 in adult [facility]) 10/05/2020   Last Assessment & Plan:  Formatting of this note might be different from the original. This is a major issue.  Getting around slowly but steadily with rolling walker.  Still driving and caring for her mother.  It is stressful.   Compression fracture of L4 vertebra (HCC) 11/24/2020   Constipation, chronic 12/17/2015   Current use of long term anticoagulation 11/24/2020   Degenerative lumbar disc 12/01/2015   Diabetes mellitus type 2 in obese 01/25/2019   Drug therapy 12/01/2015   Edema 12/17/2015   Elevated blood pressure reading in office with diagnosis of hypertension 01/19/2021   Essential (primary) hypertension 04/18/2012   Last Assessment & Plan:  Formatting of this note might be different from the original. Blood pressure under good control.  Continue with candesartan 16 mg a day, HCTZ 25 mg a day and verapamil.   Gait disturbance 01/24/2019   GERD without esophagitis 12/08/2015   Hematochezia 01/31/2019   Hematuria 12/17/2015   Hyperlipemia 04/18/2012   Hypotension, postural 09/01/2021   Incisional hernia 10/19/2021   Intertrigo 12/01/2015   Meralgia paresthetica of left side 12/01/2015   Occult blood positive stool 01/31/2019   OSA on CPAP 04/18/2012   Osteoarthrosis, localized,  primary, knee 12/01/2015   Overgrown toenails 10/29/2019   Paroxysmal atrial fibrillation (HCC) 10/02/2020   Last Assessment & Plan:  Formatting of this note might be different from the original. Asymptomatic and rate controlled.  Continue with verapamil SR 120 b.i.d. and Eliquis.  No bleeding or falls reported.   Pharyngoesophageal dysphagia 12/08/2015   Postural dizziness with presyncope 10/02/2020    Pre-syncope 10/02/2020   Shortness of breath 04/18/2012   Situational anxiety 10/02/2020   Statin intolerance 07/17/2019   Urinary incontinence 04/18/2012   Venous insufficiency 12/17/2015   Ventral hernia 12/01/2015    Past Surgical History:  Procedure Laterality Date   APPENDECTOMY     cyst removal from ovaries     PARTIAL HYSTERECTOMY      Current Medications: Current Meds  Medication Sig   Calcium Citrate-Vitamin D 315-5 MG-MCG TABS Take 1 tablet by mouth daily.   candesartan (ATACAND) 4 MG tablet Take 4 mg by mouth daily.   Docusate Sodium (STOOL SOFTENER LAXATIVE PO) Take 1 tablet by mouth as needed (constipation).   ELIQUIS 5 MG TABS tablet Take 1 tablet (5 mg total) by mouth 2 (two) times daily.   metFORMIN (GLUCOPHAGE-XR) 500 MG 24 hr tablet Take 500 mg by mouth daily.   Multiple Vitamin (MULTI-VITAMIN) tablet Take 1 tablet by mouth daily. Unknown strenght   nystatin (MYCOSTATIN/NYSTOP) powder Apply 1 application topically as needed for rash.   omeprazole (PRILOSEC) 40 MG capsule Take 40 mg by mouth daily.   torsemide (DEMADEX) 10 MG tablet Take 10 mg by mouth every other day.   verapamil (CALAN-SR) 120 MG CR tablet Take 1 tablet (120 mg total) by mouth 2 (two) times daily.     Allergies:   Penicillin g, Lisinopril, and Pravastatin   Social History   Socioeconomic History   Marital status: Married    Spouse name: Not on file   Number of children: Not on file   Years of education: Not on file   Highest education level: Not on file  Occupational History   Not on file  Tobacco Use   Smoking status: Never   Smokeless tobacco: Never  Substance and Sexual Activity   Alcohol use: Never   Drug use: Never   Sexual activity: Not Currently  Other Topics Concern   Not on file  Social History Narrative   Not on file   Social Determinants of Health   Financial Resource Strain: Not on file  Food Insecurity: Not on file  Transportation Needs: Not on file  Physical  Activity: Not on file  Stress: Not on file  Social Connections: Not on file     Family History: The patient's family history includes Heart failure (age of onset: 10) in her mother; Pneumonia in her mother.  ROS:   Please see the history of present illness.     All other systems reviewed and are negative.  EKGs/Labs/Other Studies Reviewed:    The following studies were reviewed today: Cardiac Studies & Procedures     STRESS TESTS  MYOCARDIAL PERFUSION IMAGING 06/22/2022  Narrative   The study is normal. The study is low risk.   Left ventricular function is normal. Nuclear stress EF: 55 %. The left ventricular ejection fraction is normal (55-65%). End diastolic cavity size is normal.   Prior study not available for comparison.   ECHOCARDIOGRAM  ECHOCARDIOGRAM COMPLETE 06/23/2022  Narrative ECHOCARDIOGRAM REPORT    Patient Name:   Erika Yang Date of Exam: 06/23/2022 Medical Rec #:  [id number]       Height:       62.0 in Accession #:    [id number]      Weight:       290.0 lb Date of Birth:  [birth year]       BSA:          2.239 m Patient Age:    76 years        BP:           136/80 mmHg Patient Gender: F               HR:           82 bpm. 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 Aortic Valve Disease, Arrythmias:Atrial  Fibrillation, Signs/Symptoms:Chest Pain and Dyspnea; Risk Factors:Hypertension.  Sonographer:    Margreta Journey RDCS Referring Phys: [id number] BRIAN J Dulce Sellar   Sonographer Comments: Global longitudinal strain was attempted. Atrial fibrillation controlled rate IMPRESSIONS   1. Left ventricular ejection fraction, by estimation, is 55 to 60%. Left ventricular ejection fraction by PLAX is 65 %. The left ventricle has normal function. The left ventricle has no regional wall motion abnormalities. Left ventricular diastolic parameters are indeterminate. The average left ventricular global longitudinal strain is -13.7 %. 2. Right ventricular systolic function is normal. The right ventricular size is normal. There is mildly elevated pulmonary artery systolic pressure. 3. The mitral valve is degenerative. Mild mitral valve regurgitation. No evidence of mitral stenosis. 4. The aortic valve is tricuspid. There is mild calcification of the aortic valve. There is mild thickening of the aortic valve. Aortic valve regurgitation is trivial. Mild aortic valve stenosis. 5. Aortic Normal DTA. 6. The inferior vena cava is dilated in size with <50% respiratory variability, suggesting right atrial pressure of 15 mmHg.  FINDINGS Left Ventricle: Left ventricular ejection fraction, by estimation, is 55 to 60%. Left ventricular ejection fraction by PLAX is 65 %. The left ventricle has normal function. The left ventricle has no regional wall motion abnormalities. The average left ventricular global longitudinal strain is -13.7 %. The left ventricular internal cavity size was normal in size. There is no left ventricular hypertrophy. Left ventricular diastolic parameters are indeterminate. Indeterminate filling pressures.  Right Ventricle: The right ventricular size is normal. No increase in right ventricular wall thickness. Right ventricular systolic function is normal. There is mildly elevated pulmonary artery systolic  pressure. The tricuspid regurgitant velocity is 2.71 m/s, and with an assumed right atrial pressure of 15 mmHg, the estimated right ventricular systolic pressure is 44.4 mmHg.  Left Atrium: Left atrial size was normal in size.  Right Atrium: Right atrial size was normal in size.  Pericardium: There is no evidence of pericardial effusion.  Mitral Valve: The mitral valve is degenerative in appearance. There is mild thickening of the anterior and posterior mitral valve leaflet(s). Mild mitral annular calcification. Mild mitral valve regurgitation. No evidence of mitral valve stenosis. MV peak gradient, 13.7 mmHg. The mean mitral valve gradient is 6.0 mmHg.  Tricuspid Valve: The  tricuspid valve is normal in structure. Tricuspid valve regurgitation is mild . No evidence of tricuspid stenosis.  Aortic Valve: The aortic valve is tricuspid. There is mild calcification of the aortic valve. There is mild thickening of the aortic valve. Aortic valve regurgitation is trivial. Mild aortic stenosis is present. Aortic valve mean gradient measures 14.0 mmHg. Aortic valve peak gradient measures 21.9 mmHg. Aortic valve area, by VTI measures 1.01 cm.  Pulmonic Valve: The pulmonic valve was normal in structure. Pulmonic valve regurgitation is not visualized. No evidence of pulmonic stenosis.  Aorta: The aortic arch was not well visualized, the aortic root and ascending aorta are structurally normal, with no evidence of dilitation and Normal DTA.  Venous: The pulmonary veins were not well visualized. The inferior vena cava is dilated in size with less than 50% respiratory variability, suggesting right atrial pressure of 15 mmHg.  IAS/Shunts: No atrial level shunt detected by color flow Doppler.   LEFT VENTRICLE PLAX 2D LV EF:         Left            Diastology ventricular     LV e' medial:    8.16 cm/s ejection        LV E/e' medial:  22.1 fraction by     LV e' lateral:   13.60 cm/s PLAX is 65      LV  E/e' lateral: 13.2 %. LVIDd:         4.80 cm         2D LVIDs:         3.10 cm         Longitudinal LV PW:         0.90 cm         Strain LV IVS:        0.90 cm         2D Strain GLS  -13.7 % LVOT diam:     1.70 cm         Avg: LV SV:         51 LV SV Index:   23 LVOT Area:     2.27 cm   RIGHT VENTRICLE             IVC RV Basal diam:  3.15 cm     IVC diam: 2.60 cm RV Mid diam:    2.80 cm RV S prime:     10.80 cm/s TAPSE (M-mode): 2.7 cm  LEFT ATRIUM             Index        RIGHT ATRIUM           Index LA diam:        4.70 cm 2.10 cm/m   RA Area:     21.80 cm LA Vol (A2C):   55.0 ml 24.57 ml/m  RA Volume:   62.30 ml  27.83 ml/m LA Vol (A4C):   82.5 ml 36.85 ml/m LA Biplane Vol: 67.5 ml 30.15 ml/m AORTIC VALVE AV Area (Vmax):    0.96 cm AV Area (Vmean):   0.91 cm AV Area (VTI):     1.01 cm AV Vmax:           233.75 cm/s AV Vmean:          176.750 cm/s AV VTI:            0.509 m AV Peak Grad:      21.9 mmHg AV Mean Grad:  14.0 mmHg LVOT Vmax:         99.32 cm/s LVOT Vmean:        70.940 cm/s LVOT VTI:          0.226 m LVOT/AV VTI ratio: 0.44  AORTA Ao Root diam: 2.70 cm Ao Asc diam:  2.80 cm Ao Desc diam: 1.70 cm  MITRAL VALVE                  TRICUSPID VALVE MV Area (PHT): 2.95 cm       TR Peak grad:   29.4 mmHg MV Area VTI:   1.20 cm       TR Vmax:        271.00 cm/s MV Peak grad:  13.7 mmHg MV Mean grad:  6.0 mmHg       SHUNTS MV Vmax:       1.85 m/s       Systemic VTI:  0.23 m MV Vmean:      114.0 cm/s     Systemic Diam: 1.70 cm MV Decel Time: 257 msec MR Peak grad:    78.9 mmHg MR Mean grad:    55.0 mmHg MR Vmax:         444.00 cm/s MR Vmean:        355.0 cm/s MR PISA:         0.25 cm MR PISA Eff ROA: 2 mm MR PISA Radius:  0.20 cm MV E velocity: 180.00 cm/s  Norman Herrlich MD Electronically signed by Norman Herrlich MD Signature Date/Time: 06/23/2022/5:26:06 PM    Final    MONITORS  LONG TERM MONITOR (3-14 DAYS)  09/17/2021  Narrative Patch Wear Time:  6 days and 20 hours (2022-12-09T10:08:50-0500 to 2022-12-16T07:08:24-0500)  Atrial Fibrillation occurred continuously (100% burden), ranging from 51-166 bpm (avg of 89 bpm). Isolated VEs were rare (<1.0%), VE Couplets were rare (<1.0%), and no VE Triplets were present. Ventricular Bigeminy and Trigeminy were present.  Cardiac rhythm was atrial fibrillation throughout with an average heart rate of 89 bpm Heart rate distribution daytime 50 to 110 bpm 78% of the time nighttime 97%.  There were no sustained rhythms less than 50 bpm. Ventricular ectopy was rare.  Conclusion atrial fibrillation controlled ventricular rate.            EKG:  EKG is not ordered today.   Recent Labs: 12/26/2022: BUN 16; Creatinine, Ser 0.82; NT-Pro BNP 750; Potassium 4.2; Sodium 145  Recent Lipid Panel No results found for: "CHOL", "TRIG", "HDL", "CHOLHDL", "VLDL", "LDLCALC", "LDLDIRECT"   Risk Assessment/Calculations:    CHA2DS2-VASc Score = 6   This indicates a 9.7% annual risk of stroke. The patient's score is based upon: CHF History: 1 HTN History: 1 Diabetes History: 1 Stroke History: 0 Vascular Disease History: 0 Age Score: 2 Gender Score: 1               Physical Exam:    VS:  BP 117/67 (BP Location: Left Arm, Patient Position: Sitting, Cuff Size: Normal)   Pulse 78   Ht 5\' 2"  (1.575 m)   Wt 289 lb (131.1 kg)   SpO2 96%   BMI 52.86 kg/m     Wt Readings from Last 3 Encounters:  02/06/23 289 lb (131.1 kg)  12/26/22 293 lb 6.4 oz (133.1 kg)  06/21/22 290 lb (131.5 kg)     GEN:  Well nourished, well developed in no acute distress HEENT: Normal NECK: No JVD; No carotid bruits LYMPHATICS: No lymphadenopathy CARDIAC:  Irregular rhythm, no murmurs, rubs, gallops RESPIRATORY:  Clear to auscultation without rales, wheezing or rhonchi  ABDOMEN: Soft, non-tender, non-distended MUSCULOSKELETAL: Lymphedema present; No deformity  SKIN: Warm and  dry NEUROLOGIC:  Alert and oriented x 3 PSYCHIATRIC:  Normal affect   ASSESSMENT:    1. Permanent atrial fibrillation (HCC)   2. Chronic anticoagulation   3. Hypercoagulable state (HCC)   4. Essential (primary) hypertension   5. Nonrheumatic mitral valve regurgitation   6. Nonrheumatic aortic valve stenosis   7. SOB (shortness of breath)   8. OSA (obstructive sleep apnea)    PLAN:    In order of problems listed above:  Atrial fibrillation/hypercoagulable state-rate controlled today, CHA2DS2-VASc score of 6, continue Eliquis 5 mg twice daily--no indication for dose reduction, continue verapamil 120 mg twice daily.  Recent CBC by PCP on 01/18/2023 revealed hemoglobin 13.9, hematocrit 40.6, creatinine 0.78, LFTs WNL.  Hypertension-blood pressure is well-controlled at 117/67, continue verapamil 120 mg twice daily, continue candesartan cilexetil 4 mg daily. Shortness of breath-she states her breathing is somewhat better.  She is really bothered by taking torsemide every day, we will change to every other day.  She had repeat BMET 2 weeks ago by her PCP which showed a creatinine 0.78, potassium 3.8.  Continue torsemide 10 mg every other day.  Most recent echo was not able to evaluate diastolic parameters.  She did have mildly elevated PASP.  Do not think she would be a good candidate for SGLT2i secondary to body habitus as well as urinary incontinence. Mild MR-Per most recent echo in September 2023, no indication for repeat imaging at this time. OSA- had recent sleep evaluation with recommendations to come back in for BiPAP titration.  This is being managed by neurology/her PCP.  Dispostion -change torsemide to every other day.  Return in 3 months.          Medication Adjustments/Labs and Tests Ordered: Current medicines are reviewed at length with the patient today.  Concerns regarding medicines are outlined above.  No orders of the defined types were placed in this encounter.  No orders  of the defined types were placed in this encounter.   Patient Instructions  Medication Instructions:  Your physician has recommended you make the following change in your medication:  Change Torsemide to every other day  *If you need a refill on your cardiac medications before your next appointment, please call your pharmacy*   Lab Work: NONE If you have labs (blood work) drawn today and your tests are completely normal, you will receive your results only by: MyChart Message (if you have MyChart) OR A paper copy in the mail If you have any lab test that is abnormal or we need to change your treatment, we will call you to review the results.   Testing/Procedures: NONE   Follow-Up: At [facility], you and your health needs are our priority.  As part of our continuing mission to provide you with exceptional heart care, we have created designated Provider Care Teams.  These Care Teams include your primary Cardiologist (physician) and Advanced Practice Providers (APPs -  Physician Assistants and Nurse Practitioners) who all work together to provide you with the care you need, when you need it.  We recommend signing up for the patient portal called "MyChart".  Sign up information is provided on this After Visit Summary.  MyChart is used to connect with patients for Virtual Visits (Telemedicine).  Patients are able to view lab/test results, encounter notes,  upcoming appointments, etc.  Non-urgent messages can be sent to your provider as well.   To learn more about what you can do with MyChart, go to [email or web address].    Your next appointment:   3 month(s)  Provider:   Norman Herrlich, MD    Other Instructions     Signed, Flossie Dibble, NP  02/06/2023 11:36 AM     [facility]

## 2023-05-12 NOTE — Progress Notes (Unsigned)
Cardiology Office Note:    Date:  05/15/2023   ID:  Erika Yang, DOB 1945-11-06, MRN [id number]  PCP:  Everlean Cherry, MD  Cardiologist:  Norman Herrlich, MD    Referring MD: Everlean Cherry, MD    ASSESSMENT:    1. Hypertensive heart disease with heart failure (HCC)   2. Permanent atrial fibrillation (HCC)   3. Chronic anticoagulation   4. Nonrheumatic aortic valve stenosis    PLAN:    In order of problems listed above:  Clinically she has heart failure she stopped taking torsemide because of urinary frequency volume and agrees to restart a diuretic take a minimum dose of furosemide to see if it can be tolerated and effective Her atrial fibrillation is rate controlled she will continue verapamil along with her anticoagulant Mild aortic stenosis I think that her fatigue is multifactorial related to sleep apnea obesity deconditioning and heart failure   Next appointment: 3 months   Medication Adjustments/Labs and Tests Ordered: Current medicines are reviewed at length with the patient today.  Concerns regarding medicines are outlined above.  Orders Placed This Encounter  Procedures   CBC   Basic Metabolic Panel (BMET)   Pro b natriuretic peptide (BNP)   Meds ordered this encounter  Medications   furosemide (LASIX) 20 MG tablet    Sig: Take 0.5 tablets (10 mg total) by mouth daily.    Dispense:  45 tablet    Refill:  3     History of Present Illness:    Erika Yang is a 77 y.o. female with a hx of permanent atrial fibrillation with chronic anticoagulation hypertensive heart disease mild aortic stenosis postural hypotension and shortness of breath last seen 12/26/2022 with shortness of breath and peripheral edema felt to have congestive heart failure.  Her proBNP level was modestly elevated but not in the room within range for heart failure.   She had an  echocardiogram reported 06/23/2022 left ventricle normal in size wall thickness EF 55 to 60% indeterminate  diastolic parameters normal right ventricular size function with mild elevation of the pulmonary artery systolic pressure and mild aortic stenosis mean gradient 14 mmHg.  Aortic valve is described as tricuspid with mild calcification.  Recent labs 08/29/2022 sodium 141 potassium 4.1 creatinine 0.74 01/18/2023 hemoglobin 13.9 platelets 150,000.  Compliance with diet, lifestyle and medications: Yes  She did not tolerate torsemide because of urinary frequency and inability to sleep Recently has new CPAP and is doing much better sleeping through the evenings Unfortunately still has profound fatigue unimproved by her CPAP She had aching chills last night I told her she should contact her primary care physician regarding infection especially with her urinary symptoms of frequency She found herself short of breath with activities inside the home and has marked peripheral edema She would like to restart a different diuretic furosemide will place her on 10 mg a day stop her hydrochlorothiazide and recheck labs with her fatigue including CBC and BMP She does not have orthopnea but she sleeps with CPAP no chest pain palpitation or syncope Past Medical History:  Diagnosis Date   Acute foot pain, left 01/20/2020   Anticoagulated by anticoagulation treatment 10/05/2020   Last Assessment & Plan:  Formatting of this note might be different from the original. Continue Eliquis.   Arthritis 04/18/2012   Back pain 04/18/2012   BMI 50.0-59.9, adult (HCC) 12/17/2015   Caregiver stress 07/19/2021   Caregiver with fatigue 03/09/2016   Class 3 severe obesity  due to excess calories with serious comorbidity and body mass index (BMI) of 50.0 to 59.9 in adult [facility]) 10/05/2020   Last Assessment & Plan:  Formatting of this note might be different from the original. This is a major issue.  Getting around slowly but steadily with rolling walker.  Still driving and caring for her mother.  It is stressful.   Compression fracture of  L4 vertebra (HCC) 11/24/2020   Constipation, chronic 12/17/2015   Current use of long term anticoagulation 11/24/2020   Degenerative lumbar disc 12/01/2015   Diabetes mellitus type 2 in obese 01/25/2019   Drug therapy 12/01/2015   Edema 12/17/2015   Elevated blood pressure reading in office with diagnosis of hypertension 01/19/2021   Essential (primary) hypertension 04/18/2012   Last Assessment & Plan:  Formatting of this note might be different from the original. Blood pressure under good control.  Continue with candesartan 16 mg a day, HCTZ 25 mg a day and verapamil.   Gait disturbance 01/24/2019   GERD without esophagitis 12/08/2015   Hematochezia 01/31/2019   Hematuria 12/17/2015   Hyperlipemia 04/18/2012   Hypotension, postural 09/01/2021   Incisional hernia 10/19/2021   Intertrigo 12/01/2015   Meralgia paresthetica of left side 12/01/2015   Occult blood positive stool 01/31/2019   OSA on CPAP 04/18/2012   Osteoarthrosis, localized, primary, knee 12/01/2015   Overgrown toenails 10/29/2019   Paroxysmal atrial fibrillation (HCC) 10/02/2020   Last Assessment & Plan:  Formatting of this note might be different from the original. Asymptomatic and rate controlled.  Continue with verapamil SR 120 b.i.d. and Eliquis.  No bleeding or falls reported.   Pharyngoesophageal dysphagia 12/08/2015   Postural dizziness with presyncope 10/02/2020   Pre-syncope 10/02/2020   Shortness of breath 04/18/2012   Situational anxiety 10/02/2020   Statin intolerance 07/17/2019   Urinary incontinence 04/18/2012   Venous insufficiency 12/17/2015   Ventral hernia 12/01/2015    Current Medications: Current Meds  Medication Sig   Calcium Citrate-Vitamin D 315-5 MG-MCG TABS Take 1 tablet by mouth daily.   candesartan (ATACAND) 4 MG tablet Take 4 mg by mouth daily.   Docusate Sodium (STOOL SOFTENER LAXATIVE PO) Take 1 tablet by mouth as needed (constipation).   ELIQUIS 5 MG TABS tablet Take 1 tablet (5 mg total) by mouth 2 (two) times daily.    furosemide (LASIX) 20 MG tablet Take 0.5 tablets (10 mg total) by mouth daily.   metFORMIN (GLUCOPHAGE-XR) 500 MG 24 hr tablet Take 500 mg by mouth daily.   Multiple Vitamin (MULTI-VITAMIN) tablet Take 1 tablet by mouth daily. Unknown strenght   nystatin (MYCOSTATIN/NYSTOP) powder Apply 1 application topically as needed for rash.   omeprazole (PRILOSEC) 40 MG capsule Take 40 mg by mouth daily.   verapamil (CALAN-SR) 120 MG CR tablet Take 1 tablet (120 mg total) by mouth 2 (two) times daily.      EKGs/Labs/Other Studies Reviewed:    The following studies were reviewed today:  Cardiac Studies & Procedures     STRESS TESTS  MYOCARDIAL PERFUSION IMAGING 06/22/2022  Narrative   The study is normal. The study is low risk.   Left ventricular function is normal. Nuclear stress EF: 55 %. The left ventricular ejection fraction is normal (55-65%). End diastolic cavity size is normal.   Prior study not available for comparison.   ECHOCARDIOGRAM  ECHOCARDIOGRAM COMPLETE 06/23/2022  Narrative ECHOCARDIOGRAM REPORT    Patient Name:   Erika Yang Date of Exam: 06/23/2022 Medical Rec #:  [id number]       Height:       62.0 in Accession #:    [id number]      Weight:       290.0 lb Date of Birth:  Feb 06, 1946       BSA:          2.239 m Patient Age:    76 years        BP:           136/80 mmHg Patient Gender: F               HR:           82 bpm. 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 Aortic Valve Disease, Arrythmias:Atrial Fibrillation, Signs/Symptoms:Chest Pain and Dyspnea; Risk Factors:Hypertension.  Sonographer:    Margreta Journey RDCS Referring Phys: [id number] Bernetha Anschutz J Dulce Sellar   Sonographer Comments: Global longitudinal strain was attempted. Atrial fibrillation controlled rate IMPRESSIONS   1. Left ventricular ejection fraction, by estimation, is 55 to 60%. Left ventricular ejection fraction by PLAX is 65 %. The left ventricle has normal function. The left ventricle has no regional wall motion abnormalities. Left ventricular diastolic parameters are indeterminate. The average left ventricular global longitudinal strain is -13.7 %. 2. Right ventricular systolic function is normal. The right ventricular size is normal. There is mildly elevated pulmonary artery systolic pressure. 3. The mitral valve is degenerative. Mild mitral valve regurgitation. No evidence of mitral stenosis. 4. The aortic valve is tricuspid. There is mild calcification of the aortic valve. There is mild thickening of the aortic valve. Aortic valve regurgitation is trivial. Mild aortic valve stenosis. 5. Aortic Normal DTA. 6. The inferior vena cava is dilated in size with <50% respiratory variability, suggesting right atrial pressure of 15 mmHg.  FINDINGS Left Ventricle: Left ventricular ejection fraction, by estimation, is 55 to 60%. Left ventricular ejection fraction by PLAX is 65 %. The left ventricle has normal function. The left ventricle has no regional wall motion abnormalities. The average left ventricular global longitudinal strain is -13.7 %. The left ventricular internal cavity size was normal in size. There is no left ventricular hypertrophy. Left ventricular diastolic parameters are indeterminate. Indeterminate filling pressures.  Right Ventricle: The right ventricular size  is normal. No increase in right ventricular wall thickness. Right ventricular systolic function is normal. There is mildly elevated pulmonary artery systolic pressure. The tricuspid regurgitant velocity is 2.71 m/s, and with an assumed right atrial pressure of 15 mmHg, the estimated right ventricular systolic pressure is 44.4 mmHg.  Left [facility]: Left atrial size was normal in size.  Right [facility]: Right atrial size was normal in size.  Pericardium: There is no evidence of pericardial effusion.  Mitral Valve: The mitral valve is degenerative in appearance. There is mild thickening of the anterior and posterior mitral valve leaflet(s). Mild mitral annular calcification. Mild mitral valve regurgitation. No evidence of mitral valve stenosis. MV peak gradient, 13.7 mmHg. The mean mitral valve gradient is 6.0 mmHg.  Tricuspid Valve: The  tricuspid valve is normal in structure. Tricuspid valve regurgitation is mild . No evidence of tricuspid stenosis.  Aortic Valve: The aortic valve is tricuspid. There is mild calcification of the aortic valve. There is mild thickening of the aortic valve. Aortic valve regurgitation is trivial. Mild aortic stenosis is present. Aortic valve mean gradient measures 14.0 mmHg. Aortic valve peak gradient measures 21.9 mmHg. Aortic valve area, by VTI measures 1.01 cm.  Pulmonic Valve: The pulmonic valve was normal in structure. Pulmonic valve regurgitation is not visualized. No evidence of pulmonic stenosis.  Aorta: The aortic arch was not well visualized, the aortic root and ascending aorta are structurally normal, with no evidence of dilitation and Normal DTA.  Venous: The pulmonary veins were not well visualized. The inferior vena cava is dilated in size with less than 50% respiratory variability, suggesting right atrial pressure of 15 mmHg.  IAS/Shunts: No atrial level shunt detected by color flow Doppler.   LEFT VENTRICLE PLAX 2D LV EF:         Left             Diastology ventricular     LV e' medial:    8.16 cm/s ejection        LV E/e' medial:  22.1 fraction by     LV e' lateral:   13.60 cm/s PLAX is 65      LV E/e' lateral: 13.2 %. LVIDd:         4.80 cm         2D LVIDs:         3.10 cm         Longitudinal LV PW:         0.90 cm         Strain LV IVS:        0.90 cm         2D Strain GLS  -13.7 % LVOT diam:     1.70 cm         Avg: LV SV:         51 LV SV Index:   23 LVOT Area:     2.27 cm   RIGHT VENTRICLE             IVC RV Basal diam:  3.15 cm     IVC diam: 2.60 cm RV Mid diam:    2.80 cm RV S prime:     10.80 cm/s TAPSE (M-mode): 2.7 cm  LEFT ATRIUM             Index        RIGHT ATRIUM           Index LA diam:        4.70 cm 2.10 cm/m   RA Area:     21.80 cm LA Vol (A2C):   55.0 ml 24.57 ml/m  RA Volume:   62.30 ml  27.83 ml/m LA Vol (A4C):   82.5 ml 36.85 ml/m LA Biplane Vol: 67.5 ml 30.15 ml/m AORTIC VALVE AV Area (Vmax):    0.96 cm AV Area (Vmean):   0.91 cm AV Area (VTI):     1.01 cm AV Vmax:           233.75 cm/s AV Vmean:          176.750 cm/s AV VTI:            0.509 m AV Peak Grad:      21.9 mmHg AV Mean Grad:  14.0 mmHg LVOT Vmax:         99.32 cm/s LVOT Vmean:        70.940 cm/s LVOT VTI:          0.226 m LVOT/AV VTI ratio: 0.44  AORTA Ao Root diam: 2.70 cm Ao Asc diam:  2.80 cm Ao Desc diam: 1.70 cm  MITRAL VALVE                  TRICUSPID VALVE MV Area (PHT): 2.95 cm       TR Peak grad:   29.4 mmHg MV Area VTI:   1.20 cm       TR Vmax:        271.00 cm/s MV Peak grad:  13.7 mmHg MV Mean grad:  6.0 mmHg       SHUNTS MV Vmax:       1.85 m/s       Systemic VTI:  0.23 m MV Vmean:      114.0 cm/s     Systemic Diam: 1.70 cm MV Decel Time: 257 msec MR Peak grad:    78.9 mmHg MR Mean grad:    55.0 mmHg MR Vmax:         444.00 cm/s MR Vmean:        355.0 cm/s MR PISA:         0.25 cm MR PISA Eff ROA: 2 mm MR PISA Radius:  0.20 cm MV E velocity: 180.00 cm/s  Norman Herrlich  MD Electronically signed by Norman Herrlich MD Signature Date/Time: 06/23/2022/5:26:06 PM    Final    MONITORS  LONG TERM MONITOR (3-14 DAYS) 09/16/2021  Narrative Patch Wear Time:  6 days and 20 hours (2022-12-09T10:08:50-0500 to 2022-12-16T07:08:24-0500)  Atrial Fibrillation occurred continuously (100% burden), ranging from 51-166 bpm (avg of 89 bpm). Isolated VEs were rare (<1.0%), VE Couplets were rare (<1.0%), and no VE Triplets were present. Ventricular Bigeminy and Trigeminy were present.  Cardiac rhythm was atrial fibrillation throughout with an average heart rate of 89 bpm Heart rate distribution daytime 50 to 110 bpm 78% of the time nighttime 97%.  There were no sustained rhythms less than 50 bpm. Ventricular ectopy was rare.  Conclusion atrial fibrillation controlled ventricular rate.               Recent Labs: 12/26/2022: BUN 16; Creatinine, Ser 0.82; NT-Pro BNP 750; Potassium 4.2; Sodium 145  Recent Lipid Panel No results found for: "CHOL", "TRIG", "HDL", "CHOLHDL", "VLDL", "LDLCALC", "LDLDIRECT"  Physical Exam:    VS:  BP 124/80 (BP Location: Left Arm, Patient Position: Sitting, Cuff Size: Large)   Pulse 90   Ht 5\' 2"  (1.575 m)   Wt 291 lb (132 kg)   SpO2 96%   BMI 53.22 kg/m     Wt Readings from Last 3 Encounters:  05/15/23 291 lb (132 kg)  02/06/23 289 lb (131.1 kg)  12/26/22 293 lb 6.4 oz (133.1 kg)     GEN: Marked obesity BMI exceeds 50 well nourished, well developed in no acute distress HEENT: Normal NECK: No JVD; No carotid bruits LYMPHATICS: No lymphadenopathy CARDIAC: irregular rate and rhythm variable first heart sound RESPIRATORY:  Clear to auscultation without rales, wheezing or rhonchi  ABDOMEN: Soft, non-tender, non-distended MUSCULOSKELETAL: She has marked bilateral lower extremity edema above the knee edema; No deformity  SKIN: Warm and dry NEUROLOGIC:  Alert and oriented x 3 PSYCHIATRIC:  Normal affect    Signed, Norman Herrlich, MD   05/15/2023 11:58  AM    [facility]

## 2023-05-15 ENCOUNTER — Encounter: Payer: Self-pay | Admitting: Cardiology

## 2023-05-15 ENCOUNTER — Ambulatory Visit: Payer: Medicare Other | Attending: Cardiology | Admitting: Cardiology

## 2023-05-15 VITALS — BP 124/80 | HR 90 | Ht 62.0 in | Wt 291.0 lb

## 2023-05-15 DIAGNOSIS — Z7901 Long term (current) use of anticoagulants: Secondary | ICD-10-CM | POA: Insufficient documentation

## 2023-05-15 DIAGNOSIS — R0609 Other forms of dyspnea: Secondary | ICD-10-CM | POA: Insufficient documentation

## 2023-05-15 DIAGNOSIS — I350 Nonrheumatic aortic (valve) stenosis: Secondary | ICD-10-CM | POA: Diagnosis not present

## 2023-05-15 DIAGNOSIS — I110 Hypertensive heart disease with heart failure: Secondary | ICD-10-CM | POA: Insufficient documentation

## 2023-05-15 DIAGNOSIS — R0602 Shortness of breath: Secondary | ICD-10-CM | POA: Diagnosis present

## 2023-05-15 DIAGNOSIS — I4821 Permanent atrial fibrillation: Secondary | ICD-10-CM | POA: Diagnosis not present

## 2023-05-15 MED ORDER — FUROSEMIDE 20 MG PO TABS: 10.0000 mg | ORAL_TABLET | Freq: Every day | ORAL | 3 refills | Status: AC

## 2023-05-15 NOTE — Addendum Note (Signed)
Addended by: Roxanne Mins I on: 05/15/2023 01:26 PM   Modules accepted: Orders

## 2023-05-15 NOTE — Addendum Note (Signed)
Addended by: Roxanne Mins I on: 05/15/2023 01:29 PM   Modules accepted: Orders

## 2023-05-15 NOTE — Addendum Note (Signed)
Addended by: Roxanne Mins I on: 05/15/2023 04:28 PM   Modules accepted: Orders

## 2023-05-15 NOTE — Patient Instructions (Signed)
Medication Instructions:  Your physician has recommended you make the following change in your medication:   START: Furosemide 10 mg daily STOP: Hydrochlorothiazide  *If you need a refill on your cardiac medications before your next appointment, please call your pharmacy*   Lab Work: Your physician recommends that you return for lab work in:   Labs today: CBC, BMP, Pro BNP  If you have labs (blood work) drawn today and your tests are completely normal, you will receive your results only by: MyChart Message (if you have MyChart) OR A paper copy in the mail If you have any lab test that is abnormal or we need to change your treatment, we will call you to review the results.   Testing/Procedures: None   Follow-Up: At [facility], you and your health needs are our priority.  As part of our continuing mission to provide you with exceptional heart care, we have created designated Provider Care Teams.  These Care Teams include your primary Cardiologist (physician) and Advanced Practice Providers (APPs -  Physician Assistants and Nurse Practitioners) who all work together to provide you with the care you need, when you need it.  We recommend signing up for the patient portal called "MyChart".  Sign up information is provided on this After Visit Summary.  MyChart is used to connect with patients for Virtual Visits (Telemedicine).  Patients are able to view lab/test results, encounter notes, upcoming appointments, etc.  Non-urgent messages can be sent to your provider as well.   To learn more about what you can do with MyChart, go to [email or web address].    Your next appointment:   3 month(s)  Provider:   Norman Herrlich, MD    Other Instructions None

## 2023-05-16 LAB — BASIC METABOLIC PANEL
BUN/Creatinine Ratio: 17 (ref 12–28)
BUN: 15 mg/dL (ref 8–27)
CO2: 22 mmol/L (ref 20–29)
Calcium: 9.4 mg/dL (ref 8.7–10.3)
Chloride: 103 mmol/L (ref 96–106)
Creatinine, Ser: 0.86 mg/dL (ref 0.57–1.00)
Glucose: 123 mg/dL — ABNORMAL HIGH (ref 70–99)
Potassium: 3.9 mmol/L (ref 3.5–5.2)
Sodium: 140 mmol/L (ref 134–144)
eGFR: 70 mL/min/{1.73_m2} (ref >59)

## 2023-05-16 LAB — PRO B NATRIURETIC PEPTIDE: NT-Pro BNP: 1138 pg/mL — ABNORMAL HIGH (ref 0–738)

## 2023-05-16 LAB — CBC
Hematocrit: 41.3 % (ref 34.0–46.6)
Hemoglobin: 13.9 g/dL (ref 11.1–15.9)
MCH: 33.2 pg — ABNORMAL HIGH (ref 26.6–33.0)
MCHC: 33.7 g/dL (ref 31.5–35.7)
MCV: 99 fL — ABNORMAL HIGH (ref 79–97)
Platelets: 137 10*3/uL — ABNORMAL LOW (ref 150–450)
RBC: 4.19 x10E6/uL (ref 3.77–5.28)
RDW: 12.5 % (ref 11.7–15.4)
WBC: 5.4 10*3/uL (ref 3.4–10.8)

## 2023-05-18 ENCOUNTER — Other Ambulatory Visit: Payer: Self-pay

## 2023-05-18 ENCOUNTER — Telehealth: Payer: Self-pay | Admitting: Cardiology

## 2023-05-18 NOTE — Telephone Encounter (Signed)
Pt returning nurses call regarding results. Please advise 

## 2023-05-19 NOTE — Telephone Encounter (Signed)
Patient informed of results.  

## 2023-08-19 NOTE — Progress Notes (Unsigned)
Cardiology Office Note:    Date:  08/21/2023   ID:  Erika Yang, DOB 1946/06/30, MRN [id number]  PCP:  Everlean Cherry, MD  Cardiologist:  Norman Herrlich, MD    Referring MD: Everlean Cherry, MD    ASSESSMENT:    1. Permanent atrial fibrillation (HCC)   2. Chronic anticoagulation   3. Hypertensive heart disease with heart failure (HCC)   4. Nonrheumatic aortic valve stenosis    PLAN:    In order of problems listed above:  Doing well rate controlled with low-dose verapamil and continue current anticoagulant without bleeding complication Stable she has done well with a low-dose loop diuretic continued along with very minimum dose of ARB Asymptomatic plan echocardiogram in 1 year and follow-up afterwards for aortic stenosis   Next appointment: 1 year   Medication Adjustments/Labs and Tests Ordered: Current medicines are reviewed at length with the patient today.  Concerns regarding medicines are outlined above.  No orders of the defined types were placed in this encounter.  No orders of the defined types were placed in this encounter.    History of Present Illness:    Erika Yang is a 77 y.o. female with a hx of chronic permanent atrial fibrillation with chronic anticoagulation mild aortic stenosis hypertensive heart disease with heart failure and sleep apnea with CPAP last seen 05/15/2023.She had an  echocardiogram reported 06/23/2022 left ventricle normal in size wall thickness EF 55 to 60% indeterminate diastolic parameters normal right ventricular size function with mild elevation of the pulmonary artery systolic pressure and mild aortic stenosis mean gradient 14 mmHg.  Aortic valve is described as tricuspid with mild calcification.  Recent labs 06/30/2023 hemoglobin 13.9 platelets 150,000 Lipid profiles over 01/18/2023 cholesterol 163 LDL 88 non-HDL cholesterol 105 triglycerides 82 HDL 58.   Compliance with diet, lifestyle and medications: Yes  She is improved  with her diuretic but struggles with urinary frequency if she takes it later in the day She is not having shortness of breath chest pain or syncope No bleeding with her anticoagulant She tolerates nonstatin lipid-lowering treatment Past Medical History:  Diagnosis Date   Acute foot pain, left 01/20/2020   Anticoagulated by anticoagulation treatment 10/05/2020   Last Assessment & Plan:  Formatting of this note might be different from the original. Continue Eliquis.   Arthritis 04/18/2012   Back pain 04/18/2012   BMI 50.0-59.9, adult (HCC) 12/17/2015   Caregiver stress 07/19/2021   Caregiver with fatigue 03/09/2016   Class 3 severe obesity due to excess calories with serious comorbidity and body mass index (BMI) of 50.0 to 59.9 in adult [facility]) 10/05/2020   Last Assessment & Plan:  Formatting of this note might be different from the original. This is a major issue.  Getting around slowly but steadily with rolling walker.  Still driving and caring for her mother.  It is stressful.   Compression fracture of L4 vertebra (HCC) 11/24/2020   Constipation, chronic 12/17/2015   Current use of long term anticoagulation 11/24/2020   Degenerative lumbar disc 12/01/2015   Diabetes mellitus type 2 in obese 01/25/2019   Drug therapy 12/01/2015   Edema 12/17/2015   Elevated blood pressure reading in office with diagnosis of hypertension 01/19/2021   Essential (primary) hypertension 04/18/2012   Last Assessment & Plan:  Formatting of this note might be different from the original. Blood pressure under good control.  Continue with candesartan 16 mg a day, HCTZ 25 mg a day and verapamil.  Gait disturbance 01/24/2019   GERD without esophagitis 12/08/2015   Hematochezia 01/31/2019   Hematuria 12/17/2015   Hyperlipemia 04/18/2012   Hypotension, postural 09/01/2021   Incisional hernia 10/19/2021   Intertrigo 12/01/2015   Meralgia paresthetica of left side 12/01/2015   Occult blood positive stool 01/31/2019   OSA on CPAP 04/18/2012    Osteoarthrosis, localized, primary, knee 12/01/2015   Overgrown toenails 10/29/2019   Paroxysmal atrial fibrillation (HCC) 10/02/2020   Last Assessment & Plan:  Formatting of this note might be different from the original. Asymptomatic and rate controlled.  Continue with verapamil SR 120 b.i.d. and Eliquis.  No bleeding or falls reported.   Pharyngoesophageal dysphagia 12/08/2015   Postural dizziness with presyncope 10/02/2020   Pre-syncope 10/02/2020   Shortness of breath 04/18/2012   Situational anxiety 10/02/2020   Statin intolerance 07/17/2019   Urinary incontinence 04/18/2012   Venous insufficiency 12/17/2015   Ventral hernia 12/01/2015    Current Medications: Current Meds  Medication Sig   Calcium Citrate-Vitamin D 315-5 MG-MCG TABS Take 1 tablet by mouth daily.   candesartan (ATACAND) 4 MG tablet Take 4 mg by mouth daily.   ELIQUIS 5 MG TABS tablet Take 1 tablet (5 mg total) by mouth 2 (two) times daily.   ezetimibe (ZETIA) 10 MG tablet Take 10 mg by mouth daily.   furosemide (LASIX) 20 MG tablet Take 0.5 tablets (10 mg total) by mouth daily.   metFORMIN (GLUCOPHAGE-XR) 500 MG 24 hr tablet Take 500 mg by mouth daily.   Multiple Vitamin (MULTI-VITAMIN) tablet Take 1 tablet by mouth daily. Unknown strenght   nystatin (MYCOSTATIN/NYSTOP) powder Apply 1 application topically as needed for rash.   omeprazole (PRILOSEC) 40 MG capsule Take 40 mg by mouth daily.   verapamil (CALAN-SR) 120 MG CR tablet Take 1 tablet (120 mg total) by mouth 2 (two) times daily.   [DISCONTINUED] Docusate Sodium (STOOL SOFTENER LAXATIVE PO) Take 1 tablet by mouth as needed (constipation).      EKGs/Labs/Other Studies Reviewed:    The following studies were reviewed today:  Cardiac Studies & Procedures     STRESS TESTS  MYOCARDIAL PERFUSION IMAGING 06/22/2022  Narrative   The study is normal. The study is low risk.   Left ventricular function is normal. Nuclear stress EF: 55 %. The left ventricular ejection  fraction is normal (55-65%). End diastolic cavity size is normal.   Prior study not available for comparison.   ECHOCARDIOGRAM  ECHOCARDIOGRAM COMPLETE 06/23/2022  Narrative ECHOCARDIOGRAM REPORT    Patient Name:   Erika Yang Date of Exam: 06/23/2022 Medical Rec #:  [id number]       Height:       62.0 in Accession #:    [id number]      Weight:       290.0 lb Date of Birth:  1946/08/12       BSA:          2.239 m Patient Age:    76 years        BP:           136/80 mmHg Patient Gender: F               HR:           82 bpm. Exam Location:  [place]  Procedure: 2D Echo, Cardiac Doppler, Color Doppler and Strain Analysis  Indications:    Permanent atrial fibrillation (HCC) [I48.21 (ICD-10-CM)]; Chronic anticoagulation [Z79.01 (ICD-10-CM)]; Hypertensive heart disease without heart failure [I11.9 (ICD-10-CM)];  Hypotension, postural [I95.1 (ICD-10-CM)]; Bicuspid aortic valve [Q23.1 (ICD-10-CM)]; Nonrheumatic aortic valve stenosis [I35.0 (ICD-10-CM)]; Nonrheumatic mitral valve regurgitation [I34.0 (ICD-10-CM)]; SOB (shortness of breath) [R06.02 (ICD-10-CM)]; Chest pain, unspecified type [R07.9 (ICD-10-CM)]  History:        Patient has no prior history of Echocardiogram examinations. Aortic Valve Disease, Arrythmias:Atrial Fibrillation, Signs/Symptoms:Chest Pain and Dyspnea; Risk Factors:Hypertension.  Sonographer:    Margreta Journey RDCS Referring Phys: [id number] Joslynne Klatt J Dulce Sellar   Sonographer Comments: Global longitudinal strain was attempted. Atrial fibrillation controlled rate IMPRESSIONS   1. Left ventricular ejection fraction, by estimation, is 55 to 60%. Left ventricular ejection fraction by PLAX is 65 %. The left ventricle has normal function. The left ventricle has no regional wall motion abnormalities. Left ventricular diastolic parameters are indeterminate. The average left ventricular global longitudinal strain is -13.7 %. 2. Right ventricular systolic function is  normal. The right ventricular size is normal. There is mildly elevated pulmonary artery systolic pressure. 3. The mitral valve is degenerative. Mild mitral valve regurgitation. No evidence of mitral stenosis. 4. The aortic valve is tricuspid. There is mild calcification of the aortic valve. There is mild thickening of the aortic valve. Aortic valve regurgitation is trivial. Mild aortic valve stenosis. 5. Aortic Normal DTA. 6. The inferior vena cava is dilated in size with <50% respiratory variability, suggesting right atrial pressure of 15 mmHg.  FINDINGS Left Ventricle: Left ventricular ejection fraction, by estimation, is 55 to 60%. Left ventricular ejection fraction by PLAX is 65 %. The left ventricle has normal function. The left ventricle has no regional wall motion abnormalities. The average left ventricular global longitudinal strain is -13.7 %. The left ventricular internal cavity size was normal in size. There is no left ventricular hypertrophy. Left ventricular diastolic parameters are indeterminate. Indeterminate filling pressures.  Right Ventricle: The right ventricular size is normal. No increase in right ventricular wall thickness. Right ventricular systolic function is normal. There is mildly elevated pulmonary artery systolic pressure. The tricuspid regurgitant velocity is 2.71 m/s, and with an assumed right atrial pressure of 15 mmHg, the estimated right ventricular systolic pressure is 44.4 mmHg.  Left Atrium: Left atrial size was normal in size.  Right Atrium: Right atrial size was normal in size.  Pericardium: There is no evidence of pericardial effusion.  Mitral Valve: The mitral valve is degenerative in appearance. There is mild thickening of the anterior and posterior mitral valve leaflet(s). Mild mitral annular calcification. Mild mitral valve regurgitation. No evidence of mitral valve stenosis. MV peak gradient, 13.7 mmHg. The mean mitral valve gradient is 6.0  mmHg.  Tricuspid Valve: The tricuspid valve is normal in structure. Tricuspid valve regurgitation is mild . No evidence of tricuspid stenosis.  Aortic Valve: The aortic valve is tricuspid. There is mild calcification of the aortic valve. There is mild thickening of the aortic valve. Aortic valve regurgitation is trivial. Mild aortic stenosis is present. Aortic valve mean gradient measures 14.0 mmHg. Aortic valve peak gradient measures 21.9 mmHg. Aortic valve area, by VTI measures 1.01 cm.  Pulmonic Valve: The pulmonic valve was normal in structure. Pulmonic valve regurgitation is not visualized. No evidence of pulmonic stenosis.  Aorta: The aortic arch was not well visualized, the aortic root and ascending aorta are structurally normal, with no evidence of dilitation and Normal DTA.  Venous: The pulmonary veins were not well visualized. The inferior vena cava is dilated in size with less than 50% respiratory variability, suggesting right atrial pressure of 15 mmHg.  IAS/Shunts: No atrial  level shunt detected by color flow Doppler.   LEFT VENTRICLE PLAX 2D LV EF:         Left            Diastology ventricular     LV e' medial:    8.16 cm/s ejection        LV E/e' medial:  22.1 fraction by     LV e' lateral:   13.60 cm/s PLAX is 65      LV E/e' lateral: 13.2 %. LVIDd:         4.80 cm         2D LVIDs:         3.10 cm         Longitudinal LV PW:         0.90 cm         Strain LV IVS:        0.90 cm         2D Strain GLS  -13.7 % LVOT diam:     1.70 cm         Avg: LV SV:         51 LV SV Index:   23 LVOT Area:     2.27 cm   RIGHT VENTRICLE             IVC RV Basal diam:  3.15 cm     IVC diam: 2.60 cm RV Mid diam:    2.80 cm RV S prime:     10.80 cm/s TAPSE (M-mode): 2.7 cm  LEFT ATRIUM             Index        RIGHT ATRIUM           Index LA diam:        4.70 cm 2.10 cm/m   RA Area:     21.80 cm LA Vol (A2C):   55.0 ml 24.57 ml/m  RA Volume:   62.30 ml  27.83 ml/m LA Vol  (A4C):   82.5 ml 36.85 ml/m LA Biplane Vol: 67.5 ml 30.15 ml/m AORTIC VALVE AV Area (Vmax):    0.96 cm AV Area (Vmean):   0.91 cm AV Area (VTI):     1.01 cm AV Vmax:           233.75 cm/s AV Vmean:          176.750 cm/s AV VTI:            0.509 m AV Peak Grad:      21.9 mmHg AV Mean Grad:      14.0 mmHg LVOT Vmax:         99.32 cm/s LVOT Vmean:        70.940 cm/s LVOT VTI:          0.226 m LVOT/AV VTI ratio: 0.44  AORTA Ao Root diam: 2.70 cm Ao Asc diam:  2.80 cm Ao Desc diam: 1.70 cm  MITRAL VALVE                  TRICUSPID VALVE MV Area (PHT): 2.95 cm       TR Peak grad:   29.4 mmHg MV Area VTI:   1.20 cm       TR Vmax:        271.00 cm/s MV Peak grad:  13.7 mmHg MV Mean grad:  6.0 mmHg       SHUNTS MV Vmax:  1.85 m/s       Systemic VTI:  0.23 m MV Vmean:      114.0 cm/s     Systemic Diam: 1.70 cm MV Decel Time: 257 msec MR Peak grad:    78.9 mmHg MR Mean grad:    55.0 mmHg MR Vmax:         444.00 cm/s MR Vmean:        355.0 cm/s MR PISA:         0.25 cm MR PISA Eff ROA: 2 mm MR PISA Radius:  0.20 cm MV E velocity: 180.00 cm/s  Norman Herrlich MD Electronically signed by Norman Herrlich MD Signature Date/Time: 06/23/2022/5:26:06 PM    Final    MONITORS  LONG TERM MONITOR (3-14 DAYS) 09/16/2021  Narrative Patch Wear Time:  6 days and 20 hours (2022-12-09T10:08:50-0500 to 2022-12-16T07:08:24-0500)  Atrial Fibrillation occurred continuously (100% burden), ranging from 51-166 bpm (avg of 89 bpm). Isolated VEs were rare (<1.0%), VE Couplets were rare (<1.0%), and no VE Triplets were present. Ventricular Bigeminy and Trigeminy were present.  Cardiac rhythm was atrial fibrillation throughout with an average heart rate of 89 bpm Heart rate distribution daytime 50 to 110 bpm 78% of the time nighttime 97%.  There were no sustained rhythms less than 50 bpm. Ventricular ectopy was rare.  Conclusion atrial fibrillation controlled ventricular rate.                 Physical Exam:    VS:  BP 106/64 (BP Location: Left Arm, Patient Position: Sitting)   Pulse 83   Ht 5\' 2"  (1.575 m)   Wt 277 lb (125.6 kg)   SpO2 95%   BMI 50.66 kg/m     Wt Readings from Last 3 Encounters:  08/21/23 277 lb (125.6 kg)  05/15/23 291 lb (132 kg)  02/06/23 289 lb (131.1 kg)     GEN:  Well nourished, well developed in no acute distress HEENT: Normal NECK: No JVD; No carotid bruits LYMPHATICS: No lymphadenopathy CARDIAC: 1/6 systolic ejection murmur RRR, no murmurs, rubs, gallops RESPIRATORY:  Clear to auscultation without rales, wheezing or rhonchi  ABDOMEN: Soft, non-tender, non-distended MUSCULOSKELETAL:  No edema; No deformity  SKIN: Warm and dry NEUROLOGIC:  Alert and oriented x 3 PSYCHIATRIC:  Normal affect    Signed, Norman Herrlich, MD  08/21/2023 11:30 AM    [facility]

## 2023-08-21 ENCOUNTER — Ambulatory Visit: Payer: Medicare Other | Attending: Cardiology | Admitting: Cardiology

## 2023-08-21 VITALS — BP 106/64 | HR 83 | Ht 62.0 in | Wt 277.0 lb

## 2023-08-21 DIAGNOSIS — I110 Hypertensive heart disease with heart failure: Secondary | ICD-10-CM | POA: Diagnosis present

## 2023-08-21 DIAGNOSIS — I350 Nonrheumatic aortic (valve) stenosis: Secondary | ICD-10-CM | POA: Diagnosis not present

## 2023-08-21 DIAGNOSIS — Z7901 Long term (current) use of anticoagulants: Secondary | ICD-10-CM | POA: Diagnosis present

## 2023-08-21 DIAGNOSIS — I4821 Permanent atrial fibrillation: Secondary | ICD-10-CM

## 2023-08-21 NOTE — Patient Instructions (Signed)
Medication Instructions:  Your physician recommends that you continue on your current medications as directed. Please refer to the Current Medication list given to you today.  *If you need a refill on your cardiac medications before your next appointment, please call your pharmacy*   Lab Work: Your physician recommends that you return for lab work in:   Labs today: BMP  If you have labs (blood work) drawn today and your tests are completely normal, you will receive your results only by: MyChart Message (if you have MyChart) OR A paper copy in the mail If you have any lab test that is abnormal or we need to change your treatment, we will call you to review the results.   Testing/Procedures: Your physician has requested that you have an echocardiogram. Echocardiography is a painless test that uses sound waves to create images of your heart. It provides your doctor with information about the size and shape of your heart and how well your heart's chambers and valves are working. This procedure takes approximately one hour. There are no restrictions for this procedure. Please do NOT wear cologne, perfume, aftershave, or lotions (deodorant is allowed). Please arrive 15 minutes prior to your appointment time.  Please note: We ask at that you not bring children with you during ultrasound (echo/ vascular) testing. Due to room size and safety concerns, children are not allowed in the ultrasound rooms during exams. Our front office staff cannot provide observation of children in our lobby area while testing is being conducted. An adult accompanying a patient to their appointment will only be allowed in the ultrasound room at the discretion of the ultrasound technician under special circumstances. We apologize for any inconvenience.    Follow-Up: At [facility], you and your health needs are our priority.  As part of our continuing mission to provide you with exceptional heart care, we have  created designated Provider Care Teams.  These Care Teams include your primary Cardiologist (physician) and Advanced Practice Providers (APPs -  Physician Assistants and Nurse Practitioners) who all work together to provide you with the care you need, when you need it.  We recommend signing up for the patient portal called "MyChart".  Sign up information is provided on this After Visit Summary.  MyChart is used to connect with patients for Virtual Visits (Telemedicine).  Patients are able to view lab/test results, encounter notes, upcoming appointments, etc.  Non-urgent messages can be sent to your provider as well.   To learn more about what you can do with MyChart, go to [email or web address].    Your next appointment:   1 year(s)  Provider:   Norman Herrlich, MD    Other Instructions None

## 2023-08-22 ENCOUNTER — Telehealth: Payer: Self-pay | Admitting: Emergency Medicine

## 2023-08-22 LAB — BASIC METABOLIC PANEL
BUN/Creatinine Ratio: 15 (ref 12–28)
BUN: 12 mg/dL (ref 8–27)
CO2: 22 mmol/L (ref 20–29)
Calcium: 9.8 mg/dL (ref 8.7–10.3)
Chloride: 106 mmol/L (ref 96–106)
Creatinine, Ser: 0.82 mg/dL (ref 0.57–1.00)
Glucose: 124 mg/dL — ABNORMAL HIGH (ref 70–99)
Potassium: 4 mmol/L (ref 3.5–5.2)
Sodium: 144 mmol/L (ref 134–144)
eGFR: 74 mL/min/{1.73_m2} (ref >59)

## 2023-08-22 NOTE — Telephone Encounter (Signed)
LVM

## 2023-08-22 NOTE — Telephone Encounter (Signed)
-----   Message from [facility] sent at 08/22/2023  7:35 AM EST ----- Good result continue her current medications no changes  She is not in MyChart.

## 2023-08-28 ENCOUNTER — Ambulatory Visit: Payer: Self-pay | Admitting: Podiatry

## 2023-08-30 ENCOUNTER — Ambulatory Visit: Payer: Medicare Other | Admitting: Podiatry

## 2023-09-07 ENCOUNTER — Encounter: Payer: Medicare Other | Admitting: Podiatry

## 2023-09-10 NOTE — Progress Notes (Signed)
Patient was a no-show for her scheduled appointment on 09/07/2023

## 2023-09-14 ENCOUNTER — Ambulatory Visit: Payer: Medicare Other | Admitting: Podiatry

## 2023-09-14 ENCOUNTER — Ambulatory Visit (INDEPENDENT_AMBULATORY_CARE_PROVIDER_SITE_OTHER): Payer: Medicare Other | Admitting: Podiatry

## 2023-09-14 DIAGNOSIS — L84 Corns and callosities: Secondary | ICD-10-CM

## 2023-09-14 DIAGNOSIS — E1151 Type 2 diabetes mellitus with diabetic peripheral angiopathy without gangrene: Secondary | ICD-10-CM | POA: Diagnosis not present

## 2023-09-14 DIAGNOSIS — E11628 Type 2 diabetes mellitus with other skin complications: Secondary | ICD-10-CM

## 2023-09-14 DIAGNOSIS — B351 Tinea unguium: Secondary | ICD-10-CM

## 2023-09-14 DIAGNOSIS — M79674 Pain in right toe(s): Secondary | ICD-10-CM | POA: Diagnosis not present

## 2023-09-14 DIAGNOSIS — Z7901 Long term (current) use of anticoagulants: Secondary | ICD-10-CM

## 2023-09-14 DIAGNOSIS — M79675 Pain in left toe(s): Secondary | ICD-10-CM

## 2023-09-14 NOTE — Progress Notes (Signed)
Subjective:  Patient ID: Erika Yang, female    DOB: September 25, 1946,  MRN: [id number]  Erika Yang presents to clinic today for:  Chief Complaint  Patient presents with   FOOT PAIN AND RFC    BILAT FOOT PAIN, RFC, THERE IS A SPOT ON HER HEEL ELIQUIS   Patient notes nails are thick and elongated, causing pain in shoe gear when ambulating.  She is concerned about a possible spot/sore spot on the plantar aspect of her left heel.  She is on long-term Eliquis.  Last A1c on 07/20/2023 was 6.3  PCP is Everlean Cherry, MD. last seen around 07/06/2023  Past Medical History:  Diagnosis Date   Acute foot pain, left 01/20/2020   Anticoagulated by anticoagulation treatment 10/05/2020   Last Assessment & Plan:  Formatting of this note might be different from the original. Continue Eliquis.   Arthritis 04/18/2012   Back pain 04/18/2012   BMI 50.0-59.9, adult (HCC) 12/17/2015   Caregiver stress 07/19/2021   Caregiver with fatigue 03/09/2016   Class 3 severe obesity due to excess calories with serious comorbidity and body mass index (BMI) of 50.0 to 59.9 in adult [facility]) 10/05/2020   Last Assessment & Plan:  Formatting of this note might be different from the original. This is a major issue.  Getting around slowly but steadily with rolling walker.  Still driving and caring for her mother.  It is stressful.   Compression fracture of L4 vertebra (HCC) 11/24/2020   Constipation, chronic 12/17/2015   Current use of long term anticoagulation 11/24/2020   Degenerative lumbar disc 12/01/2015   Diabetes mellitus type 2 in obese 01/25/2019   Drug therapy 12/01/2015   Edema 12/17/2015   Elevated blood pressure reading in office with diagnosis of hypertension 01/19/2021   Essential (primary) hypertension 04/18/2012   Last Assessment & Plan:  Formatting of this note might be different from the original. Blood pressure under good control.  Continue with candesartan 16 mg a day, HCTZ 25 mg a day and verapamil.   Gait  disturbance 01/24/2019   GERD without esophagitis 12/08/2015   Hematochezia 01/31/2019   Hematuria 12/17/2015   Hyperlipemia 04/18/2012   Hypotension, postural 09/01/2021   Incisional hernia 10/19/2021   Intertrigo 12/01/2015   Meralgia paresthetica of left side 12/01/2015   Occult blood positive stool 01/31/2019   OSA on CPAP 04/18/2012   Osteoarthrosis, localized, primary, knee 12/01/2015   Overgrown toenails 10/29/2019   Paroxysmal atrial fibrillation (HCC) 10/02/2020   Last Assessment & Plan:  Formatting of this note might be different from the original. Asymptomatic and rate controlled.  Continue with verapamil SR 120 b.i.d. and Eliquis.  No bleeding or falls reported.   Pharyngoesophageal dysphagia 12/08/2015   Postural dizziness with presyncope 10/02/2020   Pre-syncope 10/02/2020   Shortness of breath 04/18/2012   Situational anxiety 10/02/2020   Statin intolerance 07/17/2019   Urinary incontinence 04/18/2012   Venous insufficiency 12/17/2015   Ventral hernia 12/01/2015    Allergies  Allergen Reactions   Penicillin G Anaphylaxis and Rash   Lisinopril Other (See Comments)    Dyspnea Dyspnea    Pravastatin Other (See Comments)    Muscle pain    Objective:  Erika Yang is a pleasant 77 y.o. female in NAD. AAO x 3.  Vascular Examination: Patient has palpable DP pulse, absent PT pulse bilateral.  Delayed capillary refill bilateral toes.  Sparse digital hair bilateral.  Proximal to distal cooling WNL  bilateral.    Dermatological Examination: Interspaces are clear with no open lesions noted bilateral.  Skin is shiny and atrophic bilateral.  Nails are 3-23mm thick, with yellowish/brown discoloration, subungual debris and distal onycholysis x10.  There is pain with compression of nails x10.  There are hyperkeratotic lesions noted plantar left heel.  Neurological Examination: Protective sensation intact b/l LE. Vibratory sensation diminished b/l LE.  Patient qualifies for at-risk foot care because of  diabetes with PVD, long-term anticoagulant use.  Assessment/Plan: 1. Pain due to onychomycosis of toenails of both feet   2. Callus of foot   3. Type 2 diabetes mellitus with other skin complication, without long-term current use of insulin (HCC)   4. Long term current use of anticoagulant   5. Type II diabetes mellitus with peripheral circulatory disorder (HCC)     Mycotic nails x10 were sharply debrided with sterile nail nippers and power debriding burr to decrease bulk and length.  The large plantar heel callus was shaved with #312 blade.  Discussed diabetic foot education and daily care for the feet with the patient today.  At the patient would qualify for diabetic shoes due to diabetes with PVD and preulcerative callus, and we will get this scheduled.  Order will be placed today.   Return in about 3 months (around 12/13/2023) for [facility].   Clerance Lav, DPM, FACFAS [facility]     [address] [place], [place] [address]                Office [phone]  Fax [phone]

## 2023-11-07 ENCOUNTER — Other Ambulatory Visit: Payer: Medicare Other

## 2023-12-05 ENCOUNTER — Ambulatory Visit: Payer: Medicare Other

## 2023-12-05 DIAGNOSIS — L84 Corns and callosities: Secondary | ICD-10-CM

## 2023-12-05 DIAGNOSIS — E1151 Type 2 diabetes mellitus with diabetic peripheral angiopathy without gangrene: Secondary | ICD-10-CM

## 2023-12-05 DIAGNOSIS — M2141 Flat foot [pes planus] (acquired), right foot: Secondary | ICD-10-CM

## 2023-12-05 NOTE — Progress Notes (Signed)
 Patient presents to the office today for diabetic shoe and insole measuring.  Patient was measured with brannock device to determine size and width for 1 pair of extra depth shoes and foam casted for 3 pair of insoles.   Documentation of medical necessity will be sent to patient's treating diabetic doctor to verify and sign.   Patient's diabetic provider: Arlan Organ MD   Shoes and insoles will be ordered at that time and patient will be notified for an appointment for fitting when they arrive.  Brannock measurement: 8-8WD  Shoe choice:   [id number] / [id number] Shoe size ordered: 8.5WD Ppw and ABN signed

## 2023-12-13 ENCOUNTER — Ambulatory Visit: Payer: Medicare Other | Admitting: Podiatry

## 2023-12-21 ENCOUNTER — Ambulatory Visit (INDEPENDENT_AMBULATORY_CARE_PROVIDER_SITE_OTHER): Admitting: Podiatry

## 2023-12-21 DIAGNOSIS — M79675 Pain in left toe(s): Secondary | ICD-10-CM | POA: Diagnosis not present

## 2023-12-21 DIAGNOSIS — E1151 Type 2 diabetes mellitus with diabetic peripheral angiopathy without gangrene: Secondary | ICD-10-CM | POA: Diagnosis not present

## 2023-12-21 DIAGNOSIS — L84 Corns and callosities: Secondary | ICD-10-CM

## 2023-12-21 DIAGNOSIS — B351 Tinea unguium: Secondary | ICD-10-CM

## 2023-12-21 DIAGNOSIS — M79674 Pain in right toe(s): Secondary | ICD-10-CM | POA: Diagnosis not present

## 2023-12-21 NOTE — Progress Notes (Signed)
 Subjective:  Patient ID: Erika Yang, female    DOB: [birth year],  MRN: [id number]  Erika Yang presents to clinic today for:  Chief Complaint  Patient presents with   [facility]    [facility] with callous on the left heel. Last A1c was 6.3 in Nov. And she takes Elliquis   Patient notes nails are thick and elongated, causing pain in shoe gear when ambulating.  She has a painful callus on the plantar aspect of the left heel.  She is on long-term Eliquis.  Last A1c on 07/20/2023 was 6.3  PCP is Everlean Cherry, MD. last seen around 11/27/2023  Past Medical History:  Diagnosis Date   Acute foot pain, left 01/20/2020   Anticoagulated by anticoagulation treatment 10/05/2020   Last Assessment & Plan:  Formatting of this note might be different from the original. Continue Eliquis.   Arthritis 04/18/2012   Back pain 04/18/2012   BMI 50.0-59.9, adult (HCC) 12/17/2015   Caregiver stress 07/19/2021   Caregiver with fatigue 03/09/2016   Class 3 severe obesity due to excess calories with serious comorbidity and body mass index (BMI) of 50.0 to 59.9 in adult [facility]) 10/05/2020   Last Assessment & Plan:  Formatting of this note might be different from the original. This is a major issue.  Getting around slowly but steadily with rolling walker.  Still driving and caring for her mother.  It is stressful.   Compression fracture of L4 vertebra (HCC) 11/24/2020   Constipation, chronic 12/17/2015   Current use of long term anticoagulation 11/24/2020   Degenerative lumbar disc 12/01/2015   Diabetes mellitus type 2 in obese 01/25/2019   Drug therapy 12/01/2015   Edema 12/17/2015   Elevated blood pressure reading in office with diagnosis of hypertension 01/19/2021   Essential (primary) hypertension 04/18/2012   Last Assessment & Plan:  Formatting of this note might be different from the original. Blood pressure under good control.  Continue with candesartan 16 mg a day, HCTZ 25 mg a day and verapamil.   Gait disturbance 01/24/2019    GERD without esophagitis 12/08/2015   Hematochezia 01/31/2019   Hematuria 12/17/2015   Hyperlipemia 04/18/2012   Hypotension, postural 09/01/2021   Incisional hernia 10/19/2021   Intertrigo 12/01/2015   Meralgia paresthetica of left side 12/01/2015   Occult blood positive stool 01/31/2019   OSA on CPAP 04/18/2012   Osteoarthrosis, localized, primary, knee 12/01/2015   Overgrown toenails 10/29/2019   Paroxysmal atrial fibrillation (HCC) 10/02/2020   Last Assessment & Plan:  Formatting of this note might be different from the original. Asymptomatic and rate controlled.  Continue with verapamil SR 120 b.i.d. and Eliquis.  No bleeding or falls reported.   Pharyngoesophageal dysphagia 12/08/2015   Postural dizziness with presyncope 10/02/2020   Pre-syncope 10/02/2020   Shortness of breath 04/18/2012   Situational anxiety 10/02/2020   Statin intolerance 07/17/2019   Urinary incontinence 04/18/2012   Venous insufficiency 12/17/2015   Ventral hernia 12/01/2015    Allergies  Allergen Reactions   Penicillin G Anaphylaxis and Rash   Lisinopril Other (See Comments)    Dyspnea Dyspnea    Pravastatin Other (See Comments)    Muscle pain    Objective:  Erika Yang is a pleasant 78 y.o. female in NAD. AAO x 3.  Vascular Examination: Patient has palpable DP pulse, absent PT pulse bilateral.  Delayed capillary refill bilateral toes.  Sparse digital hair bilateral.  Proximal to distal cooling WNL bilateral.  Dermatological Examination: Interspaces are clear with no open lesions noted bilateral.  Skin is shiny and atrophic bilateral.  Nails are 3-53mm thick, with yellowish/brown discoloration, subungual debris and distal onycholysis x10.  There is pain with compression of nails x10.  There are hyperkeratotic lesions noted plantar left heel.  Neurological Examination: Protective sensation intact b/l LE. Vibratory sensation diminished b/l LE.  Patient qualifies for at-risk foot care because of diabetes with PVD,  long-term anticoagulant use.  Assessment/Plan: 1. Callus of foot   2. Pain due to onychomycosis of toenails of both feet   3. Type II diabetes mellitus with peripheral circulatory disorder (HCC)     Mycotic nails x10 were sharply debrided with sterile nail nippers and power debriding burr to decrease bulk and length.  The large plantar heel callus was shaved with #312 blade.  Patient was informed of the callus gets increased pressure because of her poor ankle/STJ position during resting stance.  This has shifted her plantar fat pad towards the side and makes the heel bone more prominent and therefore the increased heel pressure causes callus formation  Discussed an AFO for the left ankle to provide more significant support during ambulation.  She states that she is not ready for this at this time.  She will notify us if she changes her mind.   Return in about 3 months (around 03/22/2024) for [facility].   Clerance Lav, DPM, FACFAS [facility]     [address] [place], [place] [address]                Office [phone]  Fax [phone]

## 2023-12-27 ENCOUNTER — Telehealth: Payer: Self-pay

## 2023-12-27 NOTE — Telephone Encounter (Signed)
 Left Vm to schedule diabetic shoe pick up ppwk expires 03/11/24 , Reserve pt

## 2024-01-02 ENCOUNTER — Telehealth: Payer: Self-pay

## 2024-01-02 NOTE — Telephone Encounter (Signed)
 Sending shoes to [employer]

## 2024-02-13 ENCOUNTER — Telehealth: Payer: Self-pay

## 2024-02-13 NOTE — Telephone Encounter (Signed)
 Called patients cell and home phone LM that shoes are in and need to get delivered beforee 6/16 as ppw will expire then patient does have appt in July with Dr. Wyn Heater but will be expired by then

## 2024-02-27 ENCOUNTER — Ambulatory Visit (INDEPENDENT_AMBULATORY_CARE_PROVIDER_SITE_OTHER)

## 2024-02-27 DIAGNOSIS — M2142 Flat foot [pes planus] (acquired), left foot: Secondary | ICD-10-CM | POA: Diagnosis not present

## 2024-02-27 DIAGNOSIS — M2141 Flat foot [pes planus] (acquired), right foot: Secondary | ICD-10-CM

## 2024-02-27 DIAGNOSIS — L84 Corns and callosities: Secondary | ICD-10-CM | POA: Diagnosis not present

## 2024-02-27 DIAGNOSIS — E1151 Type 2 diabetes mellitus with diabetic peripheral angiopathy without gangrene: Secondary | ICD-10-CM | POA: Diagnosis not present

## 2024-03-28 ENCOUNTER — Ambulatory Visit: Admitting: Podiatry

## 2024-03-28 ENCOUNTER — Ambulatory Visit (INDEPENDENT_AMBULATORY_CARE_PROVIDER_SITE_OTHER): Admitting: Podiatry

## 2024-03-28 DIAGNOSIS — E1151 Type 2 diabetes mellitus with diabetic peripheral angiopathy without gangrene: Secondary | ICD-10-CM

## 2024-03-28 DIAGNOSIS — M21072 Valgus deformity, not elsewhere classified, left ankle: Secondary | ICD-10-CM

## 2024-03-28 DIAGNOSIS — M79674 Pain in right toe(s): Secondary | ICD-10-CM

## 2024-03-28 DIAGNOSIS — L84 Corns and callosities: Secondary | ICD-10-CM

## 2024-03-28 DIAGNOSIS — B351 Tinea unguium: Secondary | ICD-10-CM

## 2024-03-28 DIAGNOSIS — M79675 Pain in left toe(s): Secondary | ICD-10-CM

## 2024-03-28 NOTE — Progress Notes (Signed)
 Subjective:  Patient ID: Erika Yang, female    DOB: August 21, 1946,  MRN: [id number]  Erika Yang presents to clinic today for:  Chief Complaint  Patient presents with   [facility]    [facility] with callous. Last A1c was 6 in April and takes Eliquis .    Patient notes nails are thick and elongated, causing pain in shoe gear when ambulating.  She has a large callus on the plantar left heel.  She got diabetic sandals from our office but these do not seem to be supporting the deformity of her foot.  She states that she feels very unstable and like she might fall wearing the sandals.  She states she does not want to end up in a wheelchair.  PCP is Erika Debby HERO, MD. last seen 02/01/2024  Past Medical History:  Diagnosis Date   Acute foot pain, left 01/20/2020   Anticoagulated by anticoagulation treatment 10/05/2020   Last Assessment & Plan:  Formatting of this note might be different from the original. Continue Eliquis .   Arthritis 04/18/2012   Back pain 04/18/2012   BMI 50.0-59.9, adult (HCC) 12/17/2015   Caregiver stress 07/19/2021   Caregiver with fatigue 03/09/2016   Class 3 severe obesity due to excess calories with serious comorbidity and body mass index (BMI) of 50.0 to 59.9 in adult [facility]) 10/05/2020   Last Assessment & Plan:  Formatting of this note might be different from the original. This is a major issue.  Getting around slowly but steadily with rolling walker.  Still driving and caring for her mother.  It is stressful.   Compression fracture of L4 vertebra (HCC) 11/24/2020   Constipation, chronic 12/17/2015   Current use of long term anticoagulation 11/24/2020   Degenerative lumbar disc 12/01/2015   Diabetes mellitus type 2 in obese 01/25/2019   Drug therapy 12/01/2015   Edema 12/17/2015   Elevated blood pressure reading in office with diagnosis of hypertension 01/19/2021   Essential (primary) hypertension 04/18/2012   Last Assessment & Plan:  Formatting of this note might be different from the  original. Blood pressure under good control.  Continue with candesartan 16 mg a day, HCTZ 25 mg a day and verapamil .   Gait disturbance 01/24/2019   GERD without esophagitis 12/08/2015   Hematochezia 01/31/2019   Hematuria 12/17/2015   Hyperlipemia 04/18/2012   Hypotension, postural 09/01/2021   Incisional hernia 10/19/2021   Intertrigo 12/01/2015   Meralgia paresthetica of left side 12/01/2015   Occult blood positive stool 01/31/2019   OSA on CPAP 04/18/2012   Osteoarthrosis, localized, primary, knee 12/01/2015   Overgrown toenails 10/29/2019   Paroxysmal atrial fibrillation (HCC) 10/02/2020   Last Assessment & Plan:  Formatting of this note might be different from the original. Asymptomatic and rate controlled.  Continue with verapamil  SR 120 b.i.d. and Eliquis .  No bleeding or falls reported.   Pharyngoesophageal dysphagia 12/08/2015   Postural dizziness with presyncope 10/02/2020   Pre-syncope 10/02/2020   Shortness of breath 04/18/2012   Situational anxiety 10/02/2020   Statin intolerance 07/17/2019   Urinary incontinence 04/18/2012   Venous insufficiency 12/17/2015   Ventral hernia 12/01/2015   Allergies  Allergen Reactions   Penicillin G Anaphylaxis and Rash   Lisinopril Other (See Comments)    Dyspnea Dyspnea    Pravastatin Other (See Comments)    Muscle pain    Objective:  Erika Yang is a pleasant 78 y.o. female in NAD. AAO x 3.  Vascular  Examination: Patient has palpable DP pulse, absent PT pulse bilateral.  Delayed capillary refill bilateral toes.  Sparse digital hair bilateral.  Proximal to distal cooling WNL bilateral.    Dermatological Examination: Interspaces are clear with no open lesions noted bilateral.  Skin is shiny and atrophic bilateral.  Nails are 3-62mm thick, with yellowish/brown discoloration, subungual debris and distal onycholysis x10.  There is pain with compression of nails x10.  There is a large hyperkeratotic lesion on the plantar aspect of the left heel.  Orthopedic  examination: Semirigid valgus position of left heel.  This is not fully reducible with manipulation.  There is no inversion of the subtalar joint but can reduce her to almost neutral position with manipulation.  Patient qualifies for at-risk foot care because of diabetes with PVD.  Assessment/Plan: 1. Pain due to onychomycosis of toenails of both feet   2. Callus of foot   3. Type II diabetes mellitus with peripheral circulatory disorder (HCC)   4. Eversion deformity of foot, left    Mycotic nails x10 were sharply debrided with sterile nail nippers and power debriding burr to decrease bulk and length.  Hyperkeratotic lesion on plantar left heel was shaved with #312 blade.  Time was taken to discuss the heel valgus and foot deformity findings on the left.  She would benefit from an AFO, but need to consider the leg girth when fitting her for any type of brace.  She would need a custom style brace so that is made to accommodate the swelling in the left leg.  Will get her a prescription for Meryle and refer her there for evaluation for the AFO.  Otherwise she may need a diabetic boot with rigid panels along the sides to provide support, however this could cause increased friction since it is not custom molded to the hindfoot and lower leg.  After discussing all of this with her, she then stated this feels like too much trouble for her.  Informed her we will simply send in the prescription and they will call her to set up an appointment and she will follow-up from there.  Return in about 3 months (around 06/28/2024) for [facility].   Awanda CHARM Imperial, DPM, FACFAS [facility]     [address] [place], [place] [address]                Office [phone]  Fax [phone]

## 2024-04-25 ENCOUNTER — Telehealth: Payer: Self-pay | Admitting: Cardiology

## 2024-04-25 NOTE — Telephone Encounter (Signed)
 Pt called in to cancel her appt with JINNY Hoover, NP 05/21/24 because her PCP office told her she has an appt here 04/29/24 at 2:45pm. I informed her she does not have an appt on that day and that I didn't see any documentation of any call today verifying that. She states she will call Dr. Eleni office back to clarify. She asked that I still go ahead and cancel appt 05/21/24.

## 2024-05-21 ENCOUNTER — Ambulatory Visit: Admitting: Cardiology

## 2024-06-11 DIAGNOSIS — I4891 Unspecified atrial fibrillation: Secondary | ICD-10-CM | POA: Diagnosis not present

## 2024-06-17 NOTE — Progress Notes (Signed)
 Patient presents today to pick up diabetic shoes and insoles.  Patient was dispensed 1 pair of diabetic shoes and 3 pairs of total contact diabetic insoles. Fit was satisfactory. Instructions for break-in and wear was reviewed and a copy was given to the patient.   Re-appointment for regularly scheduled diabetic foot care visits or if they should experience any trouble with the shoes or insoles.

## 2024-06-26 DIAGNOSIS — I4891 Unspecified atrial fibrillation: Secondary | ICD-10-CM | POA: Diagnosis not present

## 2024-06-27 ENCOUNTER — Encounter: Admitting: Podiatry

## 2024-06-27 NOTE — Progress Notes (Signed)
 Patient did not show for her scheduled appointment this morning.

## 2024-07-03 ENCOUNTER — Encounter: Payer: Self-pay | Admitting: Internal Medicine

## 2024-07-31 NOTE — Progress Notes (Unsigned)
 [facility]  PATIENT: Erika Yang DOB: [birth year]  REFERRING DOCTOR OR PCP:  Alm Lindau, PA-C; Hendrick Blanch, MD SOURCE: patient, notes from PCP, imaging and lab reports, MRI images personally reviewed  _________________________________   HISTORICAL  CHIEF COMPLAINT:  Chief Complaint  Patient presents with   RM 11     Patient is here with sister and son for dementia eval - MMSE 29    HISTORY OF PRESENT ILLNESS:  I had the pleasure of seeing your patient, Erika Yang, at [facility] for neurologic consultation regarding her cognitive changes.  She is a 78 year old woman with A-fib, diabetes and hypertension who was noted to have cognitive issues associated with a urinary tract infection.SABRA      She was nted by her family to be acting strangely on 05/12/2024  She had some fluctuating speech.  She was not making verbal sense and was taken to the [facility] ED due to concern about a stroke (she has AFib).   She answered some questions appropriately but seemed to be unable to get words out with other questions.     She was d/c home after an MRI showed no stroke.  She had a cardiac outpatient evaluation.  She was back at baseline for at least a week.  She became disoriented in early September 2025.and had worsening cognition over a couple days.  On 06/13/2024, she had gone to the toilet and when she did not return, she was checked on.  She was confused and was taken to the ED   In the ED, she was found to have a UTI.   Her mental status seemed to be fluctuating.  She had repeat MRI of the brain which was normal for age and CT angiogram show any significant stenosis.  She was hallucinating while in the hospital.   She was rambling but not making much sense.   She had a virtual neurology consult and a concern about Lewy Body disease was raised.   Of note, this consult occurred while she was cognitively at her worst.  She became deconditioned in  the hospital and was having more difficulty with ambulation.  She was discharged to a skilled nursing facility receiving physical therapy...   Since then she has completely returned back to her cognitive baseline and scored 29/30 on the MMSE today.     She is not back to her physical baseline. She has been using a walker since 2020.   She had a sunken living room so had 3 walkers in the house,  She could do steps.  She was driving.   She did not need help to transfer.  She did not lift her foot well when she walked.   Gait worsened since the hospital stay in Aug/Sept and she is doing PT.   She is now walking 50 feet with th walker.  She is doing better with transfers/pivoting   This was easier for her before the urosepsis.   She still needs assistance to transfer.   Also of note, she is markedly obese which may have contributed to her deconditioning  She has had some tremors x years in her hands, not necessarily worsening over time.  In the hospital, she had orthostatic hypotension.                                    08/01/2024  1:46 PM  MMSE - Mini Mental State Exam  Orientation to time 5  Orientation to Place 5  Registration 3  Attention/ Calculation 4  Recall 3  Language- name 2 objects 2  Language- repeat 1  Language- follow 3 step command 3  Language- read & follow direction 1  Write a sentence 1  Copy design 1  Total score 29   Cardiac risk factors: Atrial fibrillation, hypertension, non-insulin-dependent diabetes mellitus, hyperlipidemia  MRI of the brain 06/13/2024 was normal for age showing minimal generalized cortical atrophy and a couple scattered T2/FLAIR hyperintense foci consistent with minimal chronic microvascular ischemic change.  REVIEW OF SYSTEMS: Constitutional: No fevers, chills, sweats, or change in appetite Eyes: No visual changes, double vision, eye pain Ear, nose and throat: No hearing loss, ear pain, nasal congestion, sore throat Cardiovascular: She has  atrial fibrillation and is on Eliquis . Respiratory:  No shortness of breath at rest or with exertion.   No wheezes GastrointestinaI: No nausea, vomiting, diarrhea, abdominal pain, fecal incontinence Genitourinary:  No dysuria, urinary retention or frequency.  No nocturia. Musculoskeletal:  No neck pain, back pain Integumentary: No rash, pruritus, skin lesions Neurological: as above Psychiatric: No depression at this time.  No anxiety Endocrine: No palpitations, diaphoresis, change in appetite, change in weigh or increased thirst Hematologic/Lymphatic:  No anemia, purpura, petechiae. Allergic/Immunologic: No itchy/runny eyes, nasal congestion, recent allergic reactions, rashes  ALLERGIES: Allergies  Allergen Reactions   Penicillin G Anaphylaxis and Rash   Lisinopril Other (See Comments)    Dyspnea Dyspnea    Pravastatin Other (See Comments)    Muscle pain    HOME MEDICATIONS:  Current Outpatient Medications:    Acetaminophen 500 MG PACK, Take by mouth., Disp: , Rfl:    busPIRone (BUSPAR) 7.5 MG tablet, Take 7.5 mg by mouth 2 (two) times daily., Disp: , Rfl:    Calcium Citrate-Vitamin D 315-5 MG-MCG TABS, Take 1 tablet by mouth daily., Disp: , Rfl:    candesartan (ATACAND) 4 MG tablet, Take 4 mg by mouth daily., Disp: , Rfl:    ELIQUIS  5 MG TABS tablet, Take 1 tablet (5 mg total) by mouth 2 (two) times daily., Disp: 180 tablet, Rfl: 0   ezetimibe (ZETIA) 10 MG tablet, Take 10 mg by mouth daily., Disp: , Rfl:    furosemide  (LASIX ) 20 MG tablet, Take 0.5 tablets (10 mg total) by mouth daily., Disp: 45 tablet, Rfl: 3   metFORMIN (GLUCOPHAGE-XR) 500 MG 24 hr tablet, Take 500 mg by mouth daily., Disp: , Rfl:    Multiple Vitamin (MULTI-VITAMIN) tablet, Take 1 tablet by mouth daily. Unknown strenght, Disp: , Rfl:    nystatin (MYCOSTATIN/NYSTOP) powder, Apply 1 application topically as needed for rash., Disp: , Rfl:    OLANZapine (ZYPREXA) 10 MG tablet, SMARTSIG:1 Tablet(s) By Mouth Every  Evening, Disp: , Rfl:    omeprazole (PRILOSEC) 40 MG capsule, Take 40 mg by mouth daily., Disp: , Rfl:    verapamil  (CALAN -SR) 120 MG CR tablet, Take 1 tablet (120 mg total) by mouth 2 (two) times daily., Disp: 180 tablet, Rfl: 3  PAST MEDICAL HISTORY: Past Medical History:  Diagnosis Date   Acute foot pain, left 01/20/2020   Anticoagulated by anticoagulation treatment 10/05/2020   Last Assessment & Plan:  Formatting of this note might be different from the original. Continue Eliquis .   Arthritis 04/18/2012   Back pain 04/18/2012   BMI 50.0-59.9, adult (HCC) 12/17/2015   Caregiver stress 07/19/2021   Caregiver with fatigue 03/09/2016  Class 3 severe obesity due to excess calories with serious comorbidity and body mass index (BMI) of 50.0 to 59.9 in adult [facility]) 10/05/2020   Last Assessment & Plan:  Formatting of this note might be different from the original. This is a major issue.  Getting around slowly but steadily with rolling walker.  Still driving and caring for her mother.  It is stressful.   Compression fracture of L4 vertebra (HCC) 11/24/2020   Constipation, chronic 12/17/2015   Current use of long term anticoagulation 11/24/2020   Degenerative lumbar disc 12/01/2015   Diabetes mellitus type 2 in obese 01/25/2019   Drug therapy 12/01/2015   Edema 12/17/2015   Elevated blood pressure reading in office with diagnosis of hypertension 01/19/2021   Essential (primary) hypertension 04/18/2012   Last Assessment & Plan:  Formatting of this note might be different from the original. Blood pressure under good control.  Continue with candesartan 16 mg a day, HCTZ 25 mg a day and verapamil .   Gait disturbance 01/24/2019   GERD without esophagitis 12/08/2015   Hematochezia 01/31/2019   Hematuria 12/17/2015   Hyperlipemia 04/18/2012   Hypotension, postural 09/01/2021   Incisional hernia 10/19/2021   Intertrigo 12/01/2015   Meralgia paresthetica of left side 12/01/2015   Occult blood positive stool 01/31/2019   OSA on  CPAP 04/18/2012   Osteoarthrosis, localized, primary, knee 12/01/2015   Overgrown toenails 10/29/2019   Paroxysmal atrial fibrillation (HCC) 10/02/2020   Last Assessment & Plan:  Formatting of this note might be different from the original. Asymptomatic and rate controlled.  Continue with verapamil  SR 120 b.i.d. and Eliquis .  No bleeding or falls reported.   Pharyngoesophageal dysphagia 12/08/2015   Postural dizziness with presyncope 10/02/2020   Pre-syncope 10/02/2020   Shortness of breath 04/18/2012   Situational anxiety 10/02/2020   Statin intolerance 07/17/2019   Urinary incontinence 04/18/2012   Venous insufficiency 12/17/2015   Ventral hernia 12/01/2015    PAST SURGICAL HISTORY: Past Surgical History:  Procedure Laterality Date   APPENDECTOMY     cyst removal from ovaries     PARTIAL HYSTERECTOMY      FAMILY HISTORY: Family History  Problem Relation Age of Onset   Heart failure Mother 55   Pneumonia Mother     SOCIAL HISTORY: Social History   Socioeconomic History   Marital status: Married    Spouse name: Not on file   Number of children: Not on file   Years of education: Not on file   Highest education level: Not on file  Occupational History   Not on file  Tobacco Use   Smoking status: Never   Smokeless tobacco: Never  Substance and Sexual Activity   Alcohol use: Never   Drug use: Never   Sexual activity: Not Currently  Other Topics Concern   Not on file  Social History Narrative   Not on file   Social Drivers of Health   Financial Resource Strain: Not on file  Food Insecurity: Low Risk  (01/18/2024)   Received from [facility]   Hunger Vital Sign    Within the past 12 months, you worried that your food would run out before you got money to buy more: Never true    Within the past 12 months, the food you bought just didn't last and you didn't have money to get more. : Never true  Transportation Needs: No Transportation Needs (01/18/2024)   Received from [employer]    In the  past 12 months, has lack of reliable transportation kept you from medical appointments, meetings, work or from getting things needed for daily living? : No  Physical Activity: Not on file  Stress: Not on file  Social Connections: Not on file  Intimate Partner Violence: Not on file       PHYSICAL EXAM  Vitals:   08/01/24 1351  BP: 106/82  Pulse: 77  Weight: 225 lb (102.1 kg)  Height: 5' 1 (1.549 m)    Body mass index is 42.51 kg/m.   General: The patient is an obese woman in a wheelchair in no acute distress  HEENT:  Head is [place]/AT.  Sclera are anicteric.    Neck: No carotid bruits are noted.  The neck is nontender.  Cardiovascular: The heart has a regular rate and rhythm with a normal S1 and S2. There were no murmurs, gallops or rubs.    Skin: Extremities are without rash or  edema.   Neurologic Exam  Mental status: She scored 29/30 on the Mini-Mental status examination (she lost 1 pont for serial sevens).  She is alert and oriented x 3 at the time of the examination. The patient has apparent normal recent and remote memory, with an apparently normal attention span and concentration ability.   Speech is normal.  Cranial nerves: Extraocular movements are full. Pupils are equal, round, and reactive to light and accomodation.  Visual fields are full.  Facial symmetry is present. There is good facial sensation to soft touch bilaterally.Facial strength is normal.  Trapezius and sternocleidomastoid strength is normal. No dysarthria is noted.  The tongue is midline, and the patient has symmetric elevation of the soft palate. No obvious hearing deficits are noted.  Motor:  Muscle bulk is normal.   Tone is normal. Strength is  5 / 5 in all 4 extremities.   Sensory: Sensory testing is intact to pinprick, soft touch and vibration sensation in arms and proximal leg  Coordination: Cerebellar testing reveals good finger-nose-finger and heel-to-shin  bilaterally.  Gait and station: She is able to rise up from the wheelchair using both arms to push up.  She was not stable enough to test gait.  Reflexes: Deep tendon reflexes are symmetric and 2 at the biceps and 1.   Plantar responses are flexor.    DIAGNOSTIC DATA (LABS, IMAGING, TESTING) - I reviewed patient records, labs, notes, testing and imaging myself where available.  Lab Results  Component Value Date   WBC 5.4 05/15/2023   HGB 13.9 05/15/2023   HCT 41.3 05/15/2023   MCV 99 (H) 05/15/2023   PLT 137 (L) 05/15/2023      Component Value Date/Time   NA 144 08/21/2023 1143   K 4.0 08/21/2023 1143   CL 106 08/21/2023 1143   CO2 22 08/21/2023 1143   GLUCOSE 124 (H) 08/21/2023 1143   BUN 12 08/21/2023 1143   CREATININE 0.82 08/21/2023 1143   CALCIUM 9.8 08/21/2023 1143       ASSESSMENT AND PLAN  Delirium  Gait disturbance  Benign essential tremor  Paroxysmal atrial fibrillation (HCC)  Hallucinations   In summary, Erika Yang is a 78 year old woman who had altered mental status in the setting of urosepsis.  Cognitively she is back to her baseline and she scored 29/30 on the Mini-Mental status examination today.  While in the hospital and with delirium she neurologic evaluation and a concern was raised that she might have Lewy body dementia.  Today, cognition is normal for age, muscle  tone was normal.  The minimal tremor she has is most consistent with mild benign essential tremor.  She became deconditioned in the hospital and gait is still not back to baseline.  I do not think she has Lewy body dementia, Alzheimer's disease or other neurodegenerative disease.   While in the hospital she was placed on Zyprexa and rivastigmine (still on the lowest dose).  Improvement in mental status while in the hospital was more likely due to resolution of the delirium rather than a benefit of these medications.  I would have her discontinue the Zyprexa.  If she does well after a week  she can discontinue the rivastigmine as well.  I did not schedule follow-up but could see her back if she has significant new or worsening neurologic symptoms.  Thank you for asking to see this patient.  Please let me know if I can be of further assistance with her or other patients in the future.   Jordyn Hofacker A. Vear, MD, [facility] 08/01/2024, 7:23 PM Certified in Neurology, Clinical Neurophysiology, Sleep Medicine and Neuroimaging  [facility] [address] [place], [place] [address] [phone]

## 2024-08-01 ENCOUNTER — Encounter: Payer: Self-pay | Admitting: Neurology

## 2024-08-01 ENCOUNTER — Ambulatory Visit: Admitting: Neurology

## 2024-08-01 VITALS — BP 106/82 | HR 77 | Ht 61.0 in | Wt 225.0 lb

## 2024-08-01 DIAGNOSIS — R269 Unspecified abnormalities of gait and mobility: Secondary | ICD-10-CM

## 2024-08-01 DIAGNOSIS — I480 Paroxysmal atrial fibrillation: Secondary | ICD-10-CM

## 2024-08-01 DIAGNOSIS — G250 Essential tremor: Secondary | ICD-10-CM

## 2024-08-01 DIAGNOSIS — R443 Hallucinations, unspecified: Secondary | ICD-10-CM

## 2024-08-01 DIAGNOSIS — R410 Disorientation, unspecified: Secondary | ICD-10-CM | POA: Diagnosis not present
